# Patient Record
Sex: Female | Born: 1950
Health system: Southern US, Community
[De-identification: ages and names within clinical notes are randomized; demographics above are authoritative.]

## PROBLEM LIST (undated history)

## (undated) DIAGNOSIS — E079 Disorder of thyroid, unspecified: Secondary | ICD-10-CM

## (undated) DIAGNOSIS — G20 Parkinson's disease: Secondary | ICD-10-CM

## (undated) DIAGNOSIS — I4891 Unspecified atrial fibrillation: Secondary | ICD-10-CM

## (undated) DIAGNOSIS — G20A1 Parkinson's disease without dyskinesia, without mention of fluctuations: Secondary | ICD-10-CM

## (undated) DIAGNOSIS — F419 Anxiety disorder, unspecified: Secondary | ICD-10-CM

## (undated) DIAGNOSIS — I10 Essential (primary) hypertension: Secondary | ICD-10-CM

## (undated) HISTORY — PX: CHOLECYSTECTOMY: SHX55

## (undated) HISTORY — PX: KNEE SURGERY: SHX244

---

## 2006-07-17 ENCOUNTER — Ambulatory Visit: Payer: Self-pay

## 2007-11-17 ENCOUNTER — Ambulatory Visit: Payer: Self-pay | Admitting: Specialist

## 2007-11-17 ENCOUNTER — Other Ambulatory Visit: Payer: Self-pay

## 2007-12-19 ENCOUNTER — Inpatient Hospital Stay: Payer: Self-pay | Admitting: Specialist

## 2007-12-19 ENCOUNTER — Other Ambulatory Visit: Payer: Self-pay

## 2009-11-24 ENCOUNTER — Ambulatory Visit: Payer: Self-pay | Admitting: Internal Medicine

## 2009-12-09 ENCOUNTER — Ambulatory Visit: Payer: Self-pay | Admitting: Internal Medicine

## 2011-02-13 ENCOUNTER — Ambulatory Visit: Payer: Self-pay | Admitting: Internal Medicine

## 2012-02-14 ENCOUNTER — Ambulatory Visit: Payer: Self-pay | Admitting: Internal Medicine

## 2012-08-28 DIAGNOSIS — G20 Parkinson's disease: Secondary | ICD-10-CM | POA: Diagnosis not present

## 2013-03-12 DIAGNOSIS — R52 Pain, unspecified: Secondary | ICD-10-CM | POA: Diagnosis not present

## 2013-03-12 DIAGNOSIS — G20 Parkinson's disease: Secondary | ICD-10-CM | POA: Diagnosis not present

## 2013-08-26 DIAGNOSIS — G20 Parkinson's disease: Secondary | ICD-10-CM | POA: Diagnosis not present

## 2013-08-31 DIAGNOSIS — E038 Other specified hypothyroidism: Secondary | ICD-10-CM | POA: Diagnosis not present

## 2013-08-31 DIAGNOSIS — I10 Essential (primary) hypertension: Secondary | ICD-10-CM | POA: Diagnosis not present

## 2013-08-31 DIAGNOSIS — G219 Secondary parkinsonism, unspecified: Secondary | ICD-10-CM | POA: Diagnosis not present

## 2013-09-21 ENCOUNTER — Ambulatory Visit: Payer: Self-pay | Admitting: Internal Medicine

## 2013-09-21 DIAGNOSIS — Z1231 Encounter for screening mammogram for malignant neoplasm of breast: Secondary | ICD-10-CM | POA: Diagnosis not present

## 2013-09-21 DIAGNOSIS — R928 Other abnormal and inconclusive findings on diagnostic imaging of breast: Secondary | ICD-10-CM | POA: Diagnosis not present

## 2013-09-25 ENCOUNTER — Ambulatory Visit: Payer: Self-pay | Admitting: Internal Medicine

## 2013-09-25 DIAGNOSIS — N63 Unspecified lump in unspecified breast: Secondary | ICD-10-CM | POA: Diagnosis not present

## 2013-09-25 DIAGNOSIS — R922 Inconclusive mammogram: Secondary | ICD-10-CM | POA: Diagnosis not present

## 2013-12-01 DIAGNOSIS — G219 Secondary parkinsonism, unspecified: Secondary | ICD-10-CM | POA: Diagnosis not present

## 2013-12-01 DIAGNOSIS — I10 Essential (primary) hypertension: Secondary | ICD-10-CM | POA: Diagnosis not present

## 2013-12-01 DIAGNOSIS — D649 Anemia, unspecified: Secondary | ICD-10-CM | POA: Diagnosis not present

## 2013-12-01 DIAGNOSIS — E038 Other specified hypothyroidism: Secondary | ICD-10-CM | POA: Diagnosis not present

## 2013-12-01 DIAGNOSIS — E785 Hyperlipidemia, unspecified: Secondary | ICD-10-CM | POA: Diagnosis not present

## 2013-12-15 DIAGNOSIS — E038 Other specified hypothyroidism: Secondary | ICD-10-CM | POA: Diagnosis not present

## 2013-12-15 DIAGNOSIS — I10 Essential (primary) hypertension: Secondary | ICD-10-CM | POA: Diagnosis not present

## 2013-12-15 DIAGNOSIS — G219 Secondary parkinsonism, unspecified: Secondary | ICD-10-CM | POA: Diagnosis not present

## 2014-03-02 DIAGNOSIS — G20 Parkinson's disease: Secondary | ICD-10-CM | POA: Diagnosis not present

## 2014-03-17 DIAGNOSIS — E038 Other specified hypothyroidism: Secondary | ICD-10-CM | POA: Diagnosis not present

## 2014-03-17 DIAGNOSIS — G219 Secondary parkinsonism, unspecified: Secondary | ICD-10-CM | POA: Diagnosis not present

## 2014-03-29 ENCOUNTER — Ambulatory Visit: Payer: Self-pay | Admitting: Internal Medicine

## 2014-03-29 DIAGNOSIS — N63 Unspecified lump in breast: Secondary | ICD-10-CM | POA: Diagnosis not present

## 2014-08-25 DIAGNOSIS — G20 Parkinson's disease: Secondary | ICD-10-CM | POA: Diagnosis not present

## 2015-01-03 ENCOUNTER — Encounter: Payer: Self-pay | Admitting: *Deleted

## 2015-01-03 ENCOUNTER — Emergency Department
Admission: EM | Admit: 2015-01-03 | Discharge: 2015-01-03 | Disposition: A | Discharge status: Home / Self Care | Payer: Medicare Other | Attending: Emergency Medicine | Admitting: Emergency Medicine

## 2015-01-03 ENCOUNTER — Emergency Department: Payer: Medicare Other

## 2015-01-03 DIAGNOSIS — Y9289 Other specified places as the place of occurrence of the external cause: Secondary | ICD-10-CM | POA: Diagnosis not present

## 2015-01-03 DIAGNOSIS — Y998 Other external cause status: Secondary | ICD-10-CM | POA: Insufficient documentation

## 2015-01-03 DIAGNOSIS — W010XXA Fall on same level from slipping, tripping and stumbling without subsequent striking against object, initial encounter: Secondary | ICD-10-CM | POA: Diagnosis not present

## 2015-01-03 DIAGNOSIS — I10 Essential (primary) hypertension: Secondary | ICD-10-CM | POA: Insufficient documentation

## 2015-01-03 DIAGNOSIS — M7989 Other specified soft tissue disorders: Secondary | ICD-10-CM | POA: Diagnosis not present

## 2015-01-03 DIAGNOSIS — S52591A Other fractures of lower end of right radius, initial encounter for closed fracture: Secondary | ICD-10-CM | POA: Insufficient documentation

## 2015-01-03 DIAGNOSIS — S52501A Unspecified fracture of the lower end of right radius, initial encounter for closed fracture: Secondary | ICD-10-CM | POA: Diagnosis not present

## 2015-01-03 DIAGNOSIS — M25531 Pain in right wrist: Secondary | ICD-10-CM | POA: Diagnosis not present

## 2015-01-03 DIAGNOSIS — Y9389 Activity, other specified: Secondary | ICD-10-CM | POA: Diagnosis not present

## 2015-01-03 DIAGNOSIS — S6991XA Unspecified injury of right wrist, hand and finger(s), initial encounter: Secondary | ICD-10-CM | POA: Diagnosis not present

## 2015-01-03 HISTORY — DX: Disorder of thyroid, unspecified: E07.9

## 2015-01-03 HISTORY — DX: Parkinson's disease without dyskinesia, without mention of fluctuations: G20.A1

## 2015-01-03 HISTORY — DX: Parkinson's disease: G20

## 2015-01-03 HISTORY — DX: Essential (primary) hypertension: I10

## 2015-01-03 MED ORDER — TRAMADOL HCL 50 MG PO TABS: 50.0000 mg | ORAL_TABLET | Freq: Four times a day (QID) | ORAL | Status: DC | PRN

## 2015-01-03 NOTE — ED Provider Notes (Signed)
[facility] Emergency Department Provider Note  ____________________________________________  Time seen: Approximately 10:13 PM  I have reviewed the triage vital signs and the nursing notes.   HISTORY  Chief Complaint Wrist Injury   HPI Jasmine Mcdonald is a 64 y.o. female who presents to the emergency department for evaluation of right wrist pain. She states that she tripped over something and fell backward and tried to catch herself by putting her right hand behind her. Pain intensifies with movement of the right wrist.   Past Medical History  Diagnosis Date  . Parkinson's disease   . Thyroid disease   . Hypertension     There are no active problems to display for this patient.   Past Surgical History  Procedure Laterality Date  . Cholecystectomy    . Cesarean section    . Knee surgery      Current Outpatient Rx  Name  Route  Sig  Dispense  Refill  . traMADol (ULTRAM) 50 MG tablet   Oral   Take 1 tablet (50 mg total) by mouth every 6 (six) hours as needed.   9 tablet   0     Allergies Review of patient's allergies indicates no known allergies.  No family history on file.  Social History History  Substance Use Topics  . Smoking status: Never Smoker   . Smokeless tobacco: Not on file  . Alcohol Use: No    Review of Systems Constitutional: No recent illness. Eyes: No visual changes. ENT: No sore throat. Cardiovascular: Denies chest pain or palpitations. Respiratory: Denies shortness of breath. Gastrointestinal: No abdominal pain.  Genitourinary: Negative for dysuria. Musculoskeletal: Pain in right wrist. Skin: Negative for rash. Neurological: Negative for headaches, focal weakness or numbness. 10-point ROS otherwise negative.  ____________________________________________   PHYSICAL EXAM:  VITAL SIGNS: ED Triage Vitals  Enc Vitals Group     BP 01/03/15 2059 233/110 mmHg     Pulse Rate 01/03/15 2059 85     Resp  01/03/15 2059 16     Temp 01/03/15 2059 98.6 F (37 C)     Temp Source 01/03/15 2059 Oral     SpO2 01/03/15 2059 100 %     Weight 01/03/15 2059 178 lb (80.74 kg)     Height 01/03/15 2059 5\' 6"  (1.676 m)     Head Cir --      Peak Flow --      Pain Score 01/03/15 2100 5     Pain Loc --      Pain Edu? --      Excl. in GC? --     Constitutional: Alert and oriented. Well appearing and in no acute distress. Eyes: Conjunctivae are normal. EOMI. Head: Atraumatic. Nose: No congestion/rhinnorhea. Neck: No stridor.  Respiratory: Normal respiratory effort.   Musculoskeletal: Obvious deformity noted to the radial aspect of the right wrist. Radial pulse 2+. Capillary refill within normal limits. Neurologic:  Normal speech and language. No gross focal neurologic deficits are appreciated. Speech is normal. No gait instability. Skin:  Skin is warm, dry and intact. Atraumatic. Psychiatric: Mood and affect are normal. Speech and behavior are normal.  ____________________________________________   LABS (all labs ordered are listed, but only abnormal results are displayed)  Labs Reviewed - No data to display ____________________________________________  RADIOLOGY  Subtle nondisplaced fracture of the distal radial metaphysis. ____________________________________________   PROCEDURES  Procedure(s) performed: SPLINT APPLICATION Date/Time: 11:55 PM Authorized by: Kem Boroughs Consent: Verbal consent obtained. Risks and benefits:  risks, benefits and alternatives were discussed Consent given by: patient Splint applied by: ER tech Location details: Right wrist and forearm  Splint type: Sugar tong  Supplies used: Ortho-Glass and Ace  Post-procedure: The splinted body part was neurovascularly unchanged following the procedure. Patient tolerance: Patient tolerated the procedure well with no immediate complications.      ____________________________________________   INITIAL IMPRESSION  / ASSESSMENT AND PLAN / ED COURSE  Pertinent labs & imaging results that were available during my care of the patient were reviewed by me and considered in my medical decision making (see chart for details).  Patient was advised to call Dr. Katrinka Blazing who is her orthopedist tomorrow to schedule a follow-up appointment. She was advised to return to the emergency department for any symptoms that change or worsen if she is unable schedule an appointment. ____________________________________________   FINAL CLINICAL IMPRESSION(S) / ED DIAGNOSES  Final diagnoses:  Distal radius fracture, right, closed, initial encounter      Chinita Pester, FNP 01/03/15 2355  Governor Rooks, MD 01/06/15 [phone]

## 2015-01-03 NOTE — ED Notes (Signed)
Pt states that she tripped and fell on something today, c/o pain and swelling in the right wrist.

## 2015-01-04 DIAGNOSIS — S52514A Nondisplaced fracture of right radial styloid process, initial encounter for closed fracture: Secondary | ICD-10-CM | POA: Diagnosis not present

## 2015-01-24 DIAGNOSIS — S52514D Nondisplaced fracture of right radial styloid process, subsequent encounter for closed fracture with routine healing: Secondary | ICD-10-CM | POA: Diagnosis not present

## 2015-02-08 DIAGNOSIS — S52531D Colles' fracture of right radius, subsequent encounter for closed fracture with routine healing: Secondary | ICD-10-CM | POA: Diagnosis not present

## 2015-02-08 DIAGNOSIS — S52514D Nondisplaced fracture of right radial styloid process, subsequent encounter for closed fracture with routine healing: Secondary | ICD-10-CM | POA: Diagnosis not present

## 2015-03-02 DIAGNOSIS — G20 Parkinson's disease: Secondary | ICD-10-CM | POA: Diagnosis not present

## 2015-03-08 DIAGNOSIS — S52531D Colles' fracture of right radius, subsequent encounter for closed fracture with routine healing: Secondary | ICD-10-CM | POA: Diagnosis not present

## 2015-03-14 ENCOUNTER — Ambulatory Visit: Payer: Medicare Other | Attending: Orthopedic Surgery | Admitting: Occupational Therapy

## 2015-03-14 DIAGNOSIS — M79631 Pain in right forearm: Secondary | ICD-10-CM | POA: Diagnosis not present

## 2015-03-14 DIAGNOSIS — M25631 Stiffness of right wrist, not elsewhere classified: Secondary | ICD-10-CM | POA: Insufficient documentation

## 2015-03-14 DIAGNOSIS — M6281 Muscle weakness (generalized): Secondary | ICD-10-CM | POA: Diagnosis not present

## 2015-03-14 NOTE — Therapy (Signed)
[facility] [facility] MEDICINE [address], [place], [address] Phone: [phone]   Fax:  [phone]  Occupational Therapy Treatment  Patient Details  Name: Jasmine Mcdonald MRN: [id number] Date of Birth: 06/02/1951 Referring Provider:  Juanell Fairly, MD  Encounter Date: 03/14/2015      OT End of Session - 03/14/15 1129    Visit Number 1   Number of Visits 8   Date for OT Re-Evaluation 04/11/15   OT Start Time 1002   OT Stop Time 1100   OT Time Calculation (min) 58 min   Activity Tolerance Patient tolerated treatment well   Behavior During Therapy [facility] for tasks assessed/performed      Past Medical History  Diagnosis Date  . Parkinson's disease   . Thyroid disease   . Hypertension     Past Surgical History  Procedure Laterality Date  . Cholecystectomy    . Cesarean section    . Knee surgery      There were no vitals filed for this visit.  Visit Diagnosis:  Pain of right forearm - Plan: Ot plan of care cert/re-cert  Stiffness of right wrist joint - Plan: Ot plan of care cert/re-cert  Muscle weakness - Plan: Ot plan of care cert/re-cert      Subjective Assessment - 03/14/15 1031    Subjective  Fell while helping the grand kids pick up toys    Patient Stated Goals I want to use it like before and get the pain I sometims feel on side better    Currently in Pain? No/denies            [facility] OT Assessment - 03/14/15 0001    Assessment   Diagnosis R distal radius fracture   Onset Date 01/03/15   Assessment Pt fell and was seen in ER - seen Dr Martha Clan next day and casted - then in brace - pt report she took off splint over the weekend  the whole day but pain  on ulnar side this date - pt refer to OT/hand therapy 10/4   Home  Environment   Lives With Family   Prior Function   Level of Independence Independent   Leisure Pt is retired Runner, broadcasting/film/video , likes to [employer] , house hold activities, some gardening,   drive still - pt report having hard time twisting, carrying , pulling or twisting wrist - increase pain - open bottles, writing , curling hair, squeeze soap , cook , cutting,    AROM   Right Forearm Pronation 85 Degrees   Right Forearm Supination 72 Degrees   Left Forearm Pronation 90 Degrees   Left Forearm Supination 90 Degrees   Right Wrist Extension 50 Degrees   Right Wrist Flexion 53 Degrees   Right Wrist Radial Deviation 20 Degrees   Right Wrist Ulnar Deviation 22 Degrees   Left Wrist Extension 70 Degrees   Left Wrist Flexion 84 Degrees   Left Wrist Radial Deviation 25 Degrees   Left Wrist Ulnar Deviation 30 Degrees   Strength   Right Hand Grip (lbs) 15   Right Hand Lateral Pinch 9 lbs   Right Hand 3 Point Pinch 6 lbs   Left Hand Grip (lbs) 58   Left Hand Lateral Pinch 12 lbs   Left Hand 3 Point Pinch 18 lbs                  OT Treatments/Exercises (OP) - 03/14/15 0001    RUE Fluidotherapy  Number Minutes Fluidotherapy 12 Minutes   RUE Fluidotherapy Location Hand;Wrist   Comments increaes ROM and decrease pain prior to review of HEP    Splinting   Splinting Fitted with neoprene wrist spoint to use 2hrs off and on - had decrease pain with use of splint with pronation                 OT Education - 2015/03/29 1129    Education Details HEP   Person(s) Educated Patient   Methods Explanation;Demonstration;Tactile cues;Verbal cues;Handout   Comprehension Verbal cues required;Returned demonstration;Verbalized understanding          OT Short Term Goals - 2015-03-29 1133    OT SHORT TERM GOAL #1   Title Pain on PRWHE improve by at least 15 points    Baseline PRWHE for pain 30/50 at eval    Time 3   Period Weeks   Status New   OT SHORT TERM GOAL #2   Title Wrist AROM for flexion/extention/sup /pro improve by at least 10 degrees to turn doorknob , push door open and use more in playing  with grandkids   Baseline Wrist flexion 53, extention 50, sup 72 ,  pro 85   Time 4   Period Weeks   Status New           OT Long Term Goals - Mar 29, 2015 1135    OT LONG TERM GOAL #1   Title Grip strength improve by at least 15 lbs to use hand in cooking, cutting and carry 5 lbs with more ease    Baseline grip 15 R, L 58lbs    Time 5   Period Weeks   Status New   OT LONG TERM GOAL #2   Title Function on PRWHE improve with at least 10 points    Baseline function score at eal 23.5/50   Time 4   Period Weeks   Status New   OT LONG TERM GOAL #3   Title 3 point grip improve with 3-4 lbs to close open packages    Baseline 3 point R 6, L 18lbs    Time 4   Period Weeks   Status New               Plan - 03/29/2015 1130    Clinical Impression Statement Pt present 8-9 wks post R distal radius fracture - pt was in cast  and then brace - pt took brace off over the weekend and some increaes pain with use on ulnar side of wrist - 9/10 at the worse - pt is R hand dominant  - pt show decrease ROm at wrist in all planes , decrease grip and prehension strength - pt  limited in functional use of R hand in ADL's and IADL's  - pt can benefit from OT services    Pt will benefit from skilled therapeutic intervention in order to improve on the following deficits (Retired) Decreased strength;Decreased knowledge of precautions;Decreased range of motion;Impaired UE functional use;Increased edema;Impaired flexibility;Pain   Rehab Potential Good   Clinical Impairments Affecting Rehab Potential Parkinsons   OT Frequency 2x / week   OT Duration 4 weeks   OT Treatment/Interventions Self-care/ADL training;Fluidtherapy;Therapeutic exercise;Passive range of motion;Manual Therapy;Splinting;Patient/family education   Plan Assess ROM, pain and functional use    OT Home Exercise Plan see pt instruction   Consulted and Agree with Plan of Care Patient          G-Codes - 29-Mar-2015 1139  Functional Assessment Tool Used PRWHE, ROM and grip strength , clinicial judgement     Functional Limitation Self care   Self Care Current Status (Z6109) At least 40 percent but less than 60 percent impaired, limited or restricted   Self Care Goal Status (U0454) At least 1 percent but less than 20 percent impaired, limited or restricted      Problem List There are no active problems to display for this patient.   Oletta Cohn OTR/L,CLT 03/14/2015, 11:42 AM  [place] [facility] PHYSICAL AND SPORTS MEDICINE [address], [place], [address] Phone: [phone]   Fax:  [phone]

## 2015-03-14 NOTE — Patient Instructions (Signed)
Heat PROM for wrist extention/flexion/RD UD Followed by AROM  In above as well as Sup/pro

## 2015-03-17 ENCOUNTER — Ambulatory Visit: Payer: Medicare Other | Admitting: Occupational Therapy

## 2015-03-17 DIAGNOSIS — M79631 Pain in right forearm: Secondary | ICD-10-CM

## 2015-03-17 DIAGNOSIS — M6281 Muscle weakness (generalized): Secondary | ICD-10-CM | POA: Diagnosis not present

## 2015-03-17 DIAGNOSIS — M25631 Stiffness of right wrist, not elsewhere classified: Secondary | ICD-10-CM

## 2015-03-17 NOTE — Patient Instructions (Signed)
Add table slides for wrist extention  Add PROM for sup/pro end range prior to AROM   Add teal putty for grip, lat and 3 point grip - as well as pulling with all digits and pinching alternate digits  10 reps 2 x day

## 2015-03-17 NOTE — Therapy (Signed)
[facility] [facility]ICINE [address] [place], [place], [address] Phone: [phone]   Fax:  [phone]  Occupational Therapy Treatment  Patient Details  Name: Jasmine Mcdonald MRN: [id number] Date of Birth: [birth year] Referring Provider:  Juanell FairlyKrasinski, Kevin, MD  Encounter Date: 03/17/2015      OT End of Session - 03/17/15 1019    Visit Number 2   Number of Visits 8   Date for OT Re-Evaluation 04/11/15   OT Start Time 0930   OT Stop Time 1012   OT Time Calculation (min) 42 min   Activity Tolerance Patient tolerated treatment well   Behavior [facility] tasks assessed/performed      Past Medical History  Diagnosis Date  . Parkinson's disease   . Thyroid disease   . Hypertension     Past Surgical History  Procedure Laterality Date  . Cholecystectomy    . Cesarean section    . Knee surgery      There were no vitals filed for this visit.  Visit Diagnosis:  Pain of right forearm  Stiffness of right wrist joint  Muscle weakness      Subjective Assessment - 03/17/15 0940    Subjective  I could tell difference this morning - pain is better and I was able to open my medicine bottle    Patient Stated Goals I want to use it like before and get the pain I sometims feel on side better    Currently in Pain? No/denies            [facility] Assessment - 03/17/15 0001    AROM   Right Wrist Extension 63 Degrees   Right Wrist Flexion 63 Degrees                  OT Treatments/Exercises (OP) - 03/17/15 0001    Wrist Exercises   Other wrist exercises PROM of wrist flexion /extention; supination /pronation end range - followed by AROM pain less than 2/10   Other wrist exercises 16oz hammer for UD and RD - needed min A - was doing it horizontal plain; Table slides for wrist extention 15 reps    Hand Exercises   Other Hand Exercises Teal putty for grip, lat grip, 3 point , alternate digits , as well  as pulling with all digits 10 reps each  - add to HEP   RUE Fluidotherapy   Number Minutes Fluidotherapy 12 Minutes   RUE Fluidotherapy Location Hand;Wrist   Comments decrease pain and increaese ROM                 OT Education - 03/17/15 1019    Education provided Yes   Education Details HEP   Person(s) Educated Patient   Methods Explanation;Demonstration;Tactile cues;Verbal cues;Handout   Comprehension Returned demonstration;Verbalized understanding;Verbal cues required          OT Short Term Goals - 03/14/15 1133    OT SHORT TERM GOAL #1   Title Pain on PRWHE improve by at least 15 points    Baseline PRWHE for pain 30/50 at eval    Time 3   Period Weeks   Status New   OT SHORT TERM GOAL #2   Title Wrist AROM for flexion/extention/sup /pro improve by at least 10 degrees to turn doorknob , push door open and use more in playing  with grandkids   Baseline Wrist flexion 53, extention 50, sup 72 , pro 85  Time 4   Period Weeks   Status New           OT Long Term Goals - 03/14/15 1135    OT LONG TERM GOAL #1   Title Grip strength improve by at least 15 lbs to use hand in cooking, cutting and carry 5 lbs with more ease    Baseline grip 15 R, L 58lbs    Time 5   Period Weeks   Status New   OT LONG TERM GOAL #2   Title Function on PRWHE improve with at least 10 points    Baseline function score at eal 23.5/50   Time 4   Period Weeks   Status New   OT LONG TERM GOAL #3   Title 3 point grip improve with 3-4 lbs to close open packages    Baseline 3 point R 6, L 18lbs    Time 4   Period Weeks   Status New               Plan - 03/17/15 1020    Clinical Impression Statement Pt report decreaes pain and able to open her medicine yesterday - wearing soft splint 2 hrs on and off- can switch now to 3 off and 1 on - PROM add for supination because pain better - but keep pain under 2/10 - showed increase flexion and extention since then - cont to increaes ROM  and strength in R dominant hand    Pt will benefit from skilled therapeutic intervention in order to improve on the following deficits (Retired) Decreased strength;Decreased knowledge of precautions;Decreased range of motion;Impaired UE functional use;Increased edema;Impaired flexibility;Pain   Rehab Potential Good   Clinical Impairments Affecting Rehab Potential Parkinsons   OT Frequency 2x / week   OT Duration 4 weeks   OT Treatment/Interventions Self-care/ADL training;Fluidtherapy;Therapeutic exercise;Passive range of motion;Manual Therapy;Splinting;Patient/family education   Plan HEP upgrade as needed , strengthening more    OT Home Exercise Plan see pt instruction   Consulted and Agree with Plan of Care Patient        Problem List There are no active problems to display for this patient.   Oletta Cohn OTR/L,CLT 03/17/2015, 10:23 AM  [place] [facility] PHYSICAL AND SPORTS MEDICINE [address], [place], [address] Phone: [phone]   Fax:  [phone]

## 2015-03-21 ENCOUNTER — Ambulatory Visit: Payer: Medicare Other | Admitting: Occupational Therapy

## 2015-03-22 ENCOUNTER — Ambulatory Visit: Payer: Medicare Other | Admitting: Occupational Therapy

## 2015-03-23 ENCOUNTER — Ambulatory Visit: Payer: Medicare Other | Admitting: Occupational Therapy

## 2015-03-23 DIAGNOSIS — M79631 Pain in right forearm: Secondary | ICD-10-CM | POA: Diagnosis not present

## 2015-03-23 DIAGNOSIS — M6281 Muscle weakness (generalized): Secondary | ICD-10-CM

## 2015-03-23 DIAGNOSIS — M25631 Stiffness of right wrist, not elsewhere classified: Secondary | ICD-10-CM

## 2015-03-23 NOTE — Patient Instructions (Signed)
Upgrade putty to green - and add twisting BW and FW   And hold off on UD - do only RD PROM  Add 1 lbs for wrist extention, flexion  UD and RD 1 lbs in standing - 2 x UD and 1 x RD -  SUp/pro 1 lbs   10 reps   2 x day

## 2015-03-23 NOTE — Therapy (Signed)
[place] [facility]IC[facility] [address] [place], [place], [address] Phone: [phone]   Fax:  [phone]  Occupational Therapy Treatment  Patient Details  Name: Jasmine Mcdonald MRN: [id number] Date of Birth: 09-08-1950 No Data Recorded  Encounter Date: 03/23/2015      OT End of Session - 03/23/15 1004    Visit Number 3   Number of Visits 8   Date for OT Re-Evaluation 04/11/15   OT Start Time 0912   OT Stop Time 1000   OT Time Calculation (min) 48 min   Activity Tolerance Patient tolerated treatment well   Behavior During Therapy [facility] for tasks assessed/performed      Past Medical History  Diagnosis Date  . Parkinson's disease   . Thyroid disease   . Hypertension     Past Surgical History  Procedure Laterality Date  . Cholecystectomy    . Cesarean section    . Knee surgery      There were no vitals filed for this visit.  Visit Diagnosis:  Pain of right forearm  Stiffness of right wrist joint  Muscle weakness      Subjective Assessment - 03/23/15 0911    Subjective  Using it more,not wearing brace - and did not had pain    Patient Stated Goals I want to use it like before and get the pain I sometims feel on side better    Currently in Pain? Yes   Pain Score 2    Pain Location Wrist   Pain Orientation Right   Pain Descriptors / Indicators Aching            [facility] OT Assessment - 03/23/15 0001    AROM   Right Forearm Pronation 90 Degrees   Right Forearm Supination 75 Degrees   Left Forearm Pronation 90 Degrees   Left Forearm Supination 90 Degrees   Right Wrist Extension 63 Degrees   Right Wrist Flexion 63 Degrees   Right Wrist Radial Deviation 23 Degrees   Right Wrist Ulnar Deviation 25 Degrees   Left Wrist Extension 70 Degrees   Left Wrist Flexion 85 Degrees   Left Wrist Radial Deviation 25 Degrees   Left Wrist Ulnar Deviation 30 Degrees   Strength   Right Hand Grip (lbs) 22   Right Hand  Lateral Pinch 11 lbs   Right Hand 3 Point Pinch 8 lbs   Left Hand Grip (lbs) 58   Left Hand Lateral Pinch 2 lbs   Left Hand 3 Point Pinch 18 lbs                  OT Treatments/Exercises (OP) - 03/23/15 0001    Wrist Exercises   Other wrist exercises PROM of sup/ wrist extention and flexion - followed by 1 lbs weight for wrist ext/flexiion, RD and UD in standing , sup/pro - attempted hammer  but increase pain for sup - and  compensate with flexion instead of UD - UD to be done 2 reps and 2 RD -    Other wrist exercises pt need 75% v/c and min tc to keep 3rd MC in line with mid forearm - wants to RD duringf wrist exercises    Hand Exercises   Other Hand Exercises Green putty fro gripping, lat grip , 3 point grip , pulling and twisting with all digits  - 10 to 13 reps    RUE Fluidotherapy   Number Minutes Fluidotherapy 12 Minutes   RUE Fluidotherapy  Location Hand;Wrist   Comments at [facility] to increase ROM                  OT Education - 03/23/15 1003    Education provided Yes   Education Details HEP   Person(s) Educated Patient   Methods Explanation;Demonstration;Tactile cues;Verbal cues;Handout   Comprehension Verbal cues required;Returned demonstration;Verbalized understanding          OT Short Term Goals - 03/23/15 1007    OT SHORT TERM GOAL #1   Title Pain on PRWHE improve by at least 15 points    Baseline PRWHE for pain 30/50 at eval    Time 3   Period Weeks   Status On-going   OT SHORT TERM GOAL #2   Title Wrist AROM for flexion/extention/sup /pro improve by at least 10 degrees to turn doorknob , push door open and use more in playing  with grandkids   Baseline Wrist flexion 53, extention 50, sup 72 , pro 85   Time 4   Period Weeks   Status On-going           OT Long Term Goals - 03/23/15 1007    OT LONG TERM GOAL #1   Title Grip strength improve by at least 15 lbs to use hand in cooking, cutting and carry 5 lbs with more ease    Baseline grip  15 R, L 58lbs    Time 5   Period Weeks   Status On-going   OT LONG TERM GOAL #2   Title Function on PRWHE improve with at least 10 points    Baseline function score at eal 23.5/50   Time 4   Period Weeks   Status On-going               Plan - 03/23/15 1005    Clinical Impression Statement Pt cont to show increase ROM at wrist and strength - see flowsheet - pt  still have pain in sup when added weight - could not tolerate more than 1 lbs - add to HEP strength trng with 1 lbs in all planes but need to focus on 3rd MC inline with mid forearm -  hand wants to RD during wrist exercies and gripping    Pt will benefit from skilled therapeutic intervention in order to improve on the following deficits (Retired) Decreased strength;Decreased knowledge of precautions;Decreased range of motion;Impaired UE functional use;Increased edema;Impaired flexibility;Pain   Rehab Potential Good   Clinical Impairments Affecting Rehab Potential Parkinsons   OT Frequency 2x / week   OT Duration 2 weeks   OT Treatment/Interventions Self-care/ADL training;Fluidtherapy;Therapeutic exercise;Passive range of motion;Manual Therapy;Splinting;Patient/family education   Plan HEP upgrade , strengthening to tolerance    OT Home Exercise Plan see pt instruction   Consulted and Agree with Plan of Care Patient        Problem List There are no active problems to display for this patient.   Oletta Cohn OTR/L,CLT 03/23/2015, 10:11 AM  [place] [facility] PHYSICAL AND SPORTS MEDICINE [address] [place], [address] Phone: [phone]   Fax:  [phone]  Name: Jasmine Mcdonald MRN: [id number] Date of Birth: Jan 03, 1951

## 2015-03-28 ENCOUNTER — Ambulatory Visit: Payer: Medicare Other | Admitting: Occupational Therapy

## 2015-03-28 DIAGNOSIS — M25631 Stiffness of right wrist, not elsewhere classified: Secondary | ICD-10-CM

## 2015-03-28 DIAGNOSIS — M79631 Pain in right forearm: Secondary | ICD-10-CM

## 2015-03-28 DIAGNOSIS — M6281 Muscle weakness (generalized): Secondary | ICD-10-CM | POA: Diagnosis not present

## 2015-03-28 NOTE — Therapy (Signed)
[facility] PHYSICAL AND SPORTS MEDICINE [address] [place], [place], [address] Phone: [phone]   Fax:  [phone]  Occupational Therapy Treatment  Patient Details  Name:  Jasmine Mcdonald MRN: [id number] Date of Birth: 1950/07/18 No Data Recorded  Encounter Date: 03/28/2015      OT End of Session - 03/28/15 1453    Visit Number 4   Number of Visits 8   Date for OT Re-Evaluation 04/11/15   OT Start Time 0928   OT Stop Time 1008   OT Time Calculation (min) 40 min   Activity Tolerance Patient tolerated treatment well   Behavior During Therapy [facility] for tasks assessed/performed      Past Medical History  Diagnosis Date  . Parkinson's disease   . Thyroid disease   . Hypertension     Past Surgical History  Procedure Laterality Date  . Cholecystectomy    . Cesarean section    . Knee surgery      There were no vitals filed for this visit.  Visit Diagnosis:  Pain of right forearm  Stiffness of right wrist joint  Muscle weakness      Subjective Assessment - 03/28/15 0929    Subjective  My grandson grabbed my hand and it hurts since then - that was last night - I am just tired this am - before that exercises was okay    Patient Stated Goals I want to use it like before and get the pain I sometims feel on side better    Currently in Pain? Yes   Pain Score 3    Pain Location Wrist   Pain Orientation Right   Pain Descriptors / Indicators Aching            [facility] OT Assessment - 03/28/15 0001    AROM   Right Wrist Extension --   Right Wrist Flexion --   Right Wrist Radial Deviation --   Right Wrist Ulnar Deviation --                  OT Treatments/Exercises (OP) - 03/28/15 0001    Wrist Exercises   Other wrist exercises PROM for wrist flexion/extention/ 10 reps each/; 1 lbs weigth for ext/flexion 10 reps; 1 lbs for UD and RD 10 reps standing ;   Other wrist exercises Wrist extention 1 kg ball on the wall-  push and hold for 1 min ;  SUp/pro PROM and place and hold - less pain - 1 lbs sup/rpo 10 lbs   Hand Exercises   Other Hand Exercises Green putty for griping, pulling , twisting, 3 point and lat grip    Other Hand Exercises ROlling putty add and pinching alternate digits - need Min A with grip, twisting and key grip    RUE Fluidotherapy   Number Minutes Fluidotherapy 12 Minutes   RUE Fluidotherapy Location Hand;Wrist   Comments At soc to decrease pain and increase ROM    Manual Therapy   Manual therapy comments Soft tissue mobs to distal wrist , gentle traction prior to PROM sup , place and hold -pt report decrease pain afterwards                 OT Education - 03/28/15 1453    Education provided Yes   Education Details HEP   Person(s) Educated Patient   Methods Explanation;Demonstration;Tactile cues;Verbal cues;Handout   Comprehension Verbal cues required;Returned demonstration;Verbalized understanding          OT  Short Term Goals - 03/23/15 1007    OT SHORT TERM GOAL #1   Title Pain on PRWHE improve by at least 15 points    Baseline PRWHE for pain 30/50 at eval    Time 3   Period Weeks   Status On-going   OT SHORT TERM GOAL #2   Title Wrist AROM for flexion/extention/sup /pro improve by at least 10 degrees to turn doorknob , push door open and use more in playing  with grandkids   Baseline Wrist flexion 53, extention 50, sup 72 , pro 85   Time 4   Period Weeks   Status On-going           OT Long Term Goals - 03/23/15 1007    OT LONG TERM GOAL #1   Title Grip strength improve by at least 15 lbs to use hand in cooking, cutting and carry 5 lbs with more ease    Baseline grip 15 R, L 58lbs    Time 5   Period Weeks   Status On-going   OT LONG TERM GOAL #2   Title Function on PRWHE improve with at least 10 points    Baseline function score at eal 23.5/50   Time 4   Period Weeks   Status On-going               Plan - 03/28/15 1453    Clinical  Impression Statement Pt showing progress - had initially increase pain with sup this date - but to end of session decrease - could not upgrade HEP because of that - but will reassess grip and ROM next session    Pt will benefit from skilled therapeutic intervention in order to improve on the following deficits (Retired) Decreased strength;Decreased knowledge of precautions;Decreased range of motion;Impaired UE functional use;Increased edema;Impaired flexibility;Pain   Rehab Potential Good   Clinical Impairments Affecting Rehab Potential Parkinsons   OT Frequency 2x / week   OT Treatment/Interventions Self-care/ADL training;Fluidtherapy;Therapeutic exercise;Passive range of motion;Manual Therapy;Splinting;Patient/family education   Plan Assess ROM and grip   OT Home Exercise Plan see pt instruction   Consulted and Agree with Plan of Care Patient        Problem List There are no active problems to display for this patient.   Oletta Cohn OTR/L,CLT 03/28/2015, 2:56 PM  [place] [facility] PHYSICAL AND SPORTS MEDICINE [address], [place], [address] Phone: [phone]   Fax:  [phone]  Name: Jasmine Mcdonald MRN: [id number] Date of Birth: July 24, 1950

## 2015-03-28 NOTE — Patient Instructions (Addendum)
Add to putty rolling with pinching alternate digits -  Same for others but can do place and hold wrist extention with 1 lbs if finding she is not getting up high enough Watch still out for alignment

## 2015-03-30 ENCOUNTER — Ambulatory Visit: Payer: Medicare Other | Admitting: Occupational Therapy

## 2015-03-30 DIAGNOSIS — M79631 Pain in right forearm: Secondary | ICD-10-CM | POA: Diagnosis not present

## 2015-03-30 DIAGNOSIS — M6281 Muscle weakness (generalized): Secondary | ICD-10-CM

## 2015-03-30 DIAGNOSIS — M25631 Stiffness of right wrist, not elsewhere classified: Secondary | ICD-10-CM | POA: Diagnosis not present

## 2015-03-30 NOTE — Patient Instructions (Signed)
Same HEP - but increase to 2 lbs for all  12 reps and in week can do 2 sets   Add in week dark blue firm putty to green to increase resistance  Can cont with HEP until follow up with MD the 16th Nov

## 2015-03-30 NOTE — Therapy (Signed)
[place] PHYSICAL AND SPORTS MEDICINE [address], [place], [address] Phone: [phone]   Fax:  [phone]  Occupational Therapy Treatment  Patient Details  Name: PROMYSE ARDITO MRN: [id number] Date of Birth: 27-May-1951 No Data Recorded  Encounter Date: 03/30/2015      OT End of Session - 03/30/15 1137    Visit Number 5   Number of Visits 5   Date for OT Re-Evaluation 03/30/15   OT Start Time 1040   OT Stop Time 1133   OT Time Calculation (min) 53 min   Activity Tolerance Patient tolerated treatment well   Behavior During Therapy [facility] for tasks assessed/performed      Past Medical History  Diagnosis Date  . Parkinson's disease   . Thyroid disease   . Hypertension     Past Surgical History  Procedure Laterality Date  . Cholecystectomy    . Cesarean section    . Knee surgery      There were no vitals filed for this visit.  Visit Diagnosis:  Pain of right forearm  Stiffness of right wrist joint  Muscle weakness      Subjective Assessment - 03/30/15 1048    Subjective  My hand is so much better - I have my hand back - thank you so much             [facility] OT Assessment - 03/30/15 0001    AROM   Right Forearm Pronation 90 Degrees   Right Forearm Supination 90 Degrees   Right Wrist Extension 65 Degrees   Right Wrist Flexion 67 Degrees   Right Wrist Radial Deviation 25 Degrees   Right Wrist Ulnar Deviation 28 Degrees   Left Wrist Extension 70 Degrees   Left Wrist Flexion 85 Degrees   Left Wrist Radial Deviation 25 Degrees   Left Wrist Ulnar Deviation 30 Degrees   Strength   Right Hand Grip (lbs) 25   Right Hand Lateral Pinch 11 lbs   Right Hand 3 Point Pinch 11 lbs   Left Hand Grip (lbs) 58   Left Hand Lateral Pinch 12 lbs   Left Hand 3 Point Pinch 18 lbs                  OT Treatments/Exercises (OP) - 03/30/15 0001    ADLs   ADL Comments DId PRWHE for pain and function - see score  under goals    Wrist Exercises   Other wrist exercises 2 lbs for UD /RD, sup/pro and wrist extnetion and flexion - 12 reps - can increaes in week to 2sets    Other wrist exercises Wrist extnetion fatigue after 12 reps - measurements taken for grip and ROM at [facility] - wall pushups done 12 reps - no pain with any    Hand Exercises   Other Hand Exercises Green putty grip, pulling , twisting and 3 point - add in week dark blue putty to increase resistance    RUE Fluidotherapy   Number Minutes Fluidotherapy 12 Minutes   RUE Fluidotherapy Location Hand;Wrist   Comments prior to review of HEP                 OT Education - 03/30/15 1137    Education provided Yes   Education Details HEP    Person(s) Educated Patient   Methods Explanation;Demonstration;Verbal cues;Handout;Tactile cues   Comprehension Verbalized understanding;Returned demonstration;Verbal cues required          OT Short  Term Goals - 04/27/15 1057    OT SHORT TERM GOAL #1   Title Pain on PRWHE improve by at least 15 points    Baseline Pain on PRHWE no 2/50; at eval was 30/50   Status Achieved   OT SHORT TERM GOAL #2   Title Wrist AROM for flexion/extention/sup /pro improve by at least 10 degrees to turn doorknob , push door open and use more in playing  with grandkids   Baseline Wrist flexion and extention 65- 67 degrees ,  sup WNL    Status Achieved           OT Long Term Goals - 04-27-2015 1108    OT LONG TERM GOAL #1   Title Grip strength improve by at least 15 lbs to use hand in cooking, cutting and carry 5 lbs with more ease    OT LONG TERM GOAL #2   Title Function on PRWHE improve with at least 10 points    Baseline 06/05/48  for function ; at eval 23.5/50   Status Achieved   OT LONG TERM GOAL #3   Title 3 point grip improve with 3-4 lbs to close open packages    Baseline 3 point grip improved to 11lbs    Status Achieved               Plan - 04-27-15 1138    Clinical Impression Statement Pt  made great progress in 5 viists - pain and functional use - wrist ROM and grip/prehension improved greatly - pt met all goals and is discharge from OT services with HEP to cont with until her appt with MD    OT Treatment/Interventions Self-care/ADL training;Fluidtherapy;Therapeutic exercise;Passive range of motion;Manual Therapy;Splinting;Patient/family education   Plan discharge with HEP    OT Home Exercise Plan see pt instruction   Consulted and Agree with Plan of Care Patient          G-Codes - 04/27/15 1139    Functional Assessment Tool Used PRWHE, ROM and grip strength , clinicial judgement    Functional Limitation Self care   Self Care Current Status (O9735) At least 1 percent but less than 20 percent impaired, limited or restricted   Self Care Goal Status (H2992) At least 1 percent but less than 20 percent impaired, limited or restricted   Self Care Discharge Status [phone]) At least 1 percent but less than 20 percent impaired, limited or restricted      Problem List There are no active problems to display for this patient.   Rosalyn Gess OTR/L,CLT 2015-04-27, 11:41 AM  [place] PHYSICAL AND SPORTS MEDICINE [address] [place], [address] Phone: [phone]   Fax:  [phone]  Name: SAMIE BARCLIFT MRN: [id number] Date of Birth: 1950/11/17

## 2015-09-02 DIAGNOSIS — I10 Essential (primary) hypertension: Secondary | ICD-10-CM | POA: Diagnosis not present

## 2015-09-02 DIAGNOSIS — G219 Secondary parkinsonism, unspecified: Secondary | ICD-10-CM | POA: Diagnosis not present

## 2015-09-02 DIAGNOSIS — E038 Other specified hypothyroidism: Secondary | ICD-10-CM | POA: Diagnosis not present

## 2015-11-05 DIAGNOSIS — Z79899 Other long term (current) drug therapy: Secondary | ICD-10-CM | POA: Diagnosis not present

## 2015-11-05 DIAGNOSIS — M25462 Effusion, left knee: Secondary | ICD-10-CM | POA: Diagnosis not present

## 2015-11-05 DIAGNOSIS — F419 Anxiety disorder, unspecified: Secondary | ICD-10-CM | POA: Diagnosis not present

## 2015-11-05 DIAGNOSIS — Z6829 Body mass index (BMI) 29.0-29.9, adult: Secondary | ICD-10-CM | POA: Diagnosis not present

## 2015-11-05 DIAGNOSIS — Z791 Long term (current) use of non-steroidal anti-inflammatories (NSAID): Secondary | ICD-10-CM | POA: Diagnosis not present

## 2015-11-05 DIAGNOSIS — G20 Parkinson's disease: Secondary | ICD-10-CM | POA: Diagnosis not present

## 2015-11-05 DIAGNOSIS — R52 Pain, unspecified: Secondary | ICD-10-CM | POA: Diagnosis not present

## 2015-11-05 DIAGNOSIS — M25562 Pain in left knee: Secondary | ICD-10-CM | POA: Diagnosis not present

## 2015-11-05 DIAGNOSIS — M1712 Unilateral primary osteoarthritis, left knee: Secondary | ICD-10-CM | POA: Diagnosis not present

## 2015-11-05 DIAGNOSIS — I10 Essential (primary) hypertension: Secondary | ICD-10-CM | POA: Diagnosis not present

## 2015-12-30 DIAGNOSIS — M2242 Chondromalacia patellae, left knee: Secondary | ICD-10-CM | POA: Diagnosis not present

## 2016-01-02 DIAGNOSIS — G219 Secondary parkinsonism, unspecified: Secondary | ICD-10-CM | POA: Diagnosis not present

## 2016-01-02 DIAGNOSIS — I10 Essential (primary) hypertension: Secondary | ICD-10-CM | POA: Diagnosis not present

## 2016-01-02 DIAGNOSIS — E038 Other specified hypothyroidism: Secondary | ICD-10-CM | POA: Diagnosis not present

## 2016-03-29 DIAGNOSIS — G20 Parkinson's disease: Secondary | ICD-10-CM | POA: Diagnosis not present

## 2016-04-03 DIAGNOSIS — I10 Essential (primary) hypertension: Secondary | ICD-10-CM | POA: Diagnosis not present

## 2016-04-03 DIAGNOSIS — G219 Secondary parkinsonism, unspecified: Secondary | ICD-10-CM | POA: Diagnosis not present

## 2016-04-03 DIAGNOSIS — E038 Other specified hypothyroidism: Secondary | ICD-10-CM | POA: Diagnosis not present

## 2016-04-03 DIAGNOSIS — Z23 Encounter for immunization: Secondary | ICD-10-CM | POA: Diagnosis not present

## 2016-04-03 DIAGNOSIS — E669 Obesity, unspecified: Secondary | ICD-10-CM | POA: Diagnosis not present

## 2016-07-03 DIAGNOSIS — E038 Other specified hypothyroidism: Secondary | ICD-10-CM | POA: Diagnosis not present

## 2016-07-03 DIAGNOSIS — G219 Secondary parkinsonism, unspecified: Secondary | ICD-10-CM | POA: Diagnosis not present

## 2016-07-03 DIAGNOSIS — I10 Essential (primary) hypertension: Secondary | ICD-10-CM | POA: Diagnosis not present

## 2016-07-03 DIAGNOSIS — E669 Obesity, unspecified: Secondary | ICD-10-CM | POA: Diagnosis not present

## 2017-02-10 ENCOUNTER — Emergency Department
Admission: EM | Admit: 2017-02-10 | Discharge: 2017-02-10 | Disposition: A | Discharge status: Home / Self Care | Payer: Medicare Other | Attending: Emergency Medicine | Admitting: Emergency Medicine

## 2017-02-10 ENCOUNTER — Encounter: Payer: Self-pay | Admitting: Emergency Medicine

## 2017-02-10 DIAGNOSIS — F419 Anxiety disorder, unspecified: Secondary | ICD-10-CM | POA: Diagnosis not present

## 2017-02-10 DIAGNOSIS — I10 Essential (primary) hypertension: Secondary | ICD-10-CM | POA: Diagnosis not present

## 2017-02-10 DIAGNOSIS — Z79899 Other long term (current) drug therapy: Secondary | ICD-10-CM | POA: Insufficient documentation

## 2017-02-10 DIAGNOSIS — G20 Parkinson's disease: Secondary | ICD-10-CM | POA: Insufficient documentation

## 2017-02-10 LAB — COMPREHENSIVE METABOLIC PANEL
ALK PHOS: 103 U/L (ref 38–126)
ALT: <5 U/L — ABNORMAL LOW (ref 14–54)
AST: 15 U/L (ref 15–41)
Albumin: 4.2 g/dL (ref 3.5–5.0)
Anion gap: 7 (ref 5–15)
BILIRUBIN TOTAL: 0.5 mg/dL (ref 0.3–1.2)
BUN: 17 mg/dL (ref 6–20)
CALCIUM: 9.6 mg/dL (ref 8.9–10.3)
CO2: 27 mmol/L (ref 22–32)
CREATININE: 0.79 mg/dL (ref 0.44–1.00)
Chloride: 108 mmol/L (ref 101–111)
GFR calc Af Amer: >60 mL/min (ref >60)
Glucose, Bld: 124 mg/dL — ABNORMAL HIGH (ref 65–99)
Potassium: 4.5 mmol/L (ref 3.5–5.1)
Sodium: 142 mmol/L (ref 135–145)
Total Protein: 7.4 g/dL (ref 6.5–8.1)

## 2017-02-10 LAB — URINALYSIS, COMPLETE (UACMP) WITH MICROSCOPIC
Bilirubin Urine: NEGATIVE
GLUCOSE, UA: NEGATIVE mg/dL
HGB URINE DIPSTICK: NEGATIVE
Ketones, ur: NEGATIVE mg/dL
Leukocytes, UA: NEGATIVE
Nitrite: NEGATIVE
PH: 7 (ref 5.0–8.0)
Protein, ur: NEGATIVE mg/dL
RBC / HPF: NONE SEEN RBC/hpf (ref 0–5)
SPECIFIC GRAVITY, URINE: 1.006 (ref 1.005–1.030)

## 2017-02-10 LAB — CBC WITH DIFFERENTIAL/PLATELET
Basophils Absolute: 0 10*3/uL (ref 0–0.1)
Basophils Relative: 1 %
Eosinophils Absolute: 0.1 10*3/uL (ref 0–0.7)
Eosinophils Relative: 2 %
HCT: 41.2 % (ref 35.0–47.0)
Hemoglobin: 14.2 g/dL (ref 12.0–16.0)
LYMPHS PCT: 11 %
Lymphs Abs: 0.9 10*3/uL — ABNORMAL LOW (ref 1.0–3.6)
MCH: 33.4 pg (ref 26.0–34.0)
MCHC: 34.6 g/dL (ref 32.0–36.0)
MCV: 96.7 fL (ref 80.0–100.0)
Monocytes Absolute: 0.4 10*3/uL (ref 0.2–0.9)
Monocytes Relative: 5 %
Neutro Abs: 6.5 10*3/uL (ref 1.4–6.5)
Neutrophils Relative %: 81 %
Platelets: 241 10*3/uL (ref 150–440)
RBC: 4.26 MIL/uL (ref 3.80–5.20)
RDW: 13.7 % (ref 11.5–14.5)
WBC: 7.9 10*3/uL (ref 3.6–11.0)

## 2017-02-10 MED ORDER — ALPRAZOLAM 0.5 MG PO TABS
1.0000 mg | ORAL_TABLET | Freq: Once | ORAL | Status: AC
  Administered 2017-02-10: 1 mg via ORAL
  Filled 2017-02-10: qty 2

## 2017-02-10 NOTE — ED Provider Notes (Addendum)
[facility] Emergency Department Provider Note  ____________________________________________   I have reviewed the triage vital signs and the nursing notes.   HISTORY  Chief Complaint Anxiety and Hypertension    HPI Jasmine Mcdonald is a 66 y.o. female presents today complaining of feeling anxious. She does Parkinson's disease. She states when she takes her Parkinson's medications, and does not work right away she gets very anxious.she states that this is the medication starts working she gets less anxious. She has not taken it yet this evening. She does have a history of hypertension. She denies chest pain or shortness of breath or focal numbness or weakness. She is followed very closely by neurology. Family are concerned about her level of anxiety about her Parkinson's. They would like me to give her something for her nerves while she is here.       Past Medical History:  Diagnosis Date  . Hypertension   . Parkinson's disease (HCC)   . Thyroid disease     There are no active problems to display for this patient.   Past Surgical History:  Procedure Laterality Date  . CESAREAN SECTION    . CHOLECYSTECTOMY    . KNEE SURGERY      Prior to Admission medications   Medication Sig Start Date End Date Taking? Authorizing Provider  traMADol (ULTRAM) 50 MG tablet Take 1 tablet (50 mg total) by mouth every 6 (six) hours as needed. 01/03/15   Chinita Pester, FNP    Allergies Patient has no known allergies.  History reviewed. No pertinent family history.  Social History Social History  Substance Use Topics  . Smoking status: Never Smoker  . Smokeless tobacco: Never Used  . Alcohol use No    Review of Systems Constitutional: No fever/chills Eyes: No visual changes. ENT: No sore throat. No stiff neck no neck pain Cardiovascular: Denies chest pain. Respiratory: Denies shortness of breath. Gastrointestinal:   no vomiting.  No diarrhea.  No  constipation. Genitourinary: Negative for dysuria. Musculoskeletal: Negative lower extremity swelling Skin: Negative for rash. Neurological: Negative for severe headaches, focal weakness or numbness.   ____________________________________________   PHYSICAL EXAM:  VITAL SIGNS: ED Triage Vitals  Enc Vitals Group     BP 02/10/17 1918 (!) 198/86     Pulse Rate 02/10/17 1918 76     Resp 02/10/17 1918 16     Temp 02/10/17 1918 98.6 F (37 C)     Temp Source 02/10/17 1918 Oral     SpO2 02/10/17 1918 98 %     Weight 02/10/17 1919 154 lb (69.9 kg)     Height 02/10/17 1919  (1.702 m)     Head Circumference --      Peak Flow --      Pain Score --      Pain Loc --      Pain Edu? --      Excl. in GC? --     Constitutional: Alert and oriented. Well appearing and in no acute distress. Eyes: Conjunctivae are normal Head: Atraumatic HEENT: No congestion/rhinnorhea. Mucous membranes are moist.  Oropharynx non-erythematous Neck:   Nontender with no meningismus, no masses, no stridor Cardiovascular: Normal rate, regular rhythm. Grossly normal heart sounds.  Good peripheral circulation. Respiratory: Normal respiratory effort.  No retractions. Lungs CTAB. Abdominal: Soft and nontender. No distention. No guarding no rebound Back:  There is no focal tenderness or step off.  there is no midline tenderness there are no lesions  noted. there is no CVA tenderness Musculoskeletal: No lower extremity tenderness, no upper extremity tenderness. No joint effusions, no DVT signs strong distal pulses no edema Neurologic:  Normal speech and language. No gross focal neurologic deficits are appreciated.  Skin:  Skin is warm, dry and intact. No rash noted. Psychiatric: Mood and affect are quite anxious. Speech and behavior are normal.  ____________________________________________   LABS (all labs ordered are listed, but only abnormal results are displayed)  Labs Reviewed  CBC WITH  DIFFERENTIAL/PLATELET  COMPREHENSIVE METABOLIC PANEL  URINALYSIS, COMPLETE (UACMP) WITH MICROSCOPIC   ____________________________________________  EKG  I personally interpreted any EKGs ordered by me or triage sinus rhythm, rate 65 bpm no acute ST elevation or depression nonspecificST changes ____________________________________________  RADIOLOGY  I reviewed any imaging ordered by me or triage that were performed during my shift and, if possible, patient and/or family made aware of any abnormal findings. ____________________________________________   PROCEDURES  Procedure(s) performed: None  Procedures  Critical Care performed: None  ____________________________________________   INITIAL IMPRESSION / ASSESSMENT AND PLAN / ED COURSE  Pertinent labs & imaging results that were available during my care of the patient were reviewed by me and considered in my medical decision making (see chart for details).  patient here feeling anxious about her Parkinson's disease but with no evidence of focal numbness or weakness and no evidence of inability to ambulate and apparently at her neurologic baseline. No evidence of ACS PE or dissection. The family do basic blood work to ensure that there is no other occult pathology lurking underneath the surface that could be causing her to feel worse and we will give her something for her nerves here. The husband takes Xanax and she feels that that would help her and I will certainly give her a dose here although I will not send her home with Xanax, I will defer that to primary care.  ----------------------------------------- 11:12 PM on 02/10/2017 -----------------------------------------  patient in no acute distress feels "great" after the Xanax,would like to go home. Return precautions and follow-up given and understood.I have expanded to her that we don't usually prescribe that medication of the emergency room. Patient states her based  problem is few that her medications are not went to work. I have encouraged her to seek help with her primary care doctor, and possibly with counseling. There does not appear to be any acute organic pathology present patient and family are very interested in discharge at this time. We will give him instructions and they will come back if they feel worse    ____________________________________________   FINAL CLINICAL IMPRESSION(S) / ED DIAGNOSES  Final diagnoses:  None      This chart was dictated using voice recognition software.  Despite best efforts to proofread,  errors can occur which can change meaning.      Jeanmarie PlantMcShane, Larene Ascencio A, MD 02/10/17 2122    Jeanmarie PlantMcShane, Brenya Taulbee A, MD 02/10/17 2313    Jeanmarie PlantMcShane, Tawonna Esquer A, MD 02/10/17 [phone]

## 2017-02-10 NOTE — ED Triage Notes (Signed)
Patient presents to Emergency Department via [facility] with complaints of anxiety. Pt reports history of thyroid, Parkinson's and HTN.  Pt reports compliance with but between pt's anxiety, new meds that haven't started yet and pt's family anxiety pt felt she needed to come to ED for additional help.  Pt calm and cooperative, denies SI/HI/hx of behavioral problems.    One family member will be allowed back per this RN judgement and location of medical pt in quad.

## 2017-02-12 DIAGNOSIS — G219 Secondary parkinsonism, unspecified: Secondary | ICD-10-CM | POA: Diagnosis not present

## 2017-02-12 DIAGNOSIS — I10 Essential (primary) hypertension: Secondary | ICD-10-CM | POA: Diagnosis not present

## 2017-02-12 DIAGNOSIS — E038 Other specified hypothyroidism: Secondary | ICD-10-CM | POA: Diagnosis not present

## 2017-02-12 DIAGNOSIS — E669 Obesity, unspecified: Secondary | ICD-10-CM | POA: Diagnosis not present

## 2017-03-26 DIAGNOSIS — Z23 Encounter for immunization: Secondary | ICD-10-CM | POA: Diagnosis not present

## 2017-03-26 DIAGNOSIS — Z79810 Long term (current) use of selective estrogen receptor modulators (SERMs): Secondary | ICD-10-CM | POA: Diagnosis not present

## 2017-03-26 DIAGNOSIS — I10 Essential (primary) hypertension: Secondary | ICD-10-CM | POA: Diagnosis not present

## 2017-03-26 DIAGNOSIS — G219 Secondary parkinsonism, unspecified: Secondary | ICD-10-CM | POA: Diagnosis not present

## 2017-03-26 DIAGNOSIS — E669 Obesity, unspecified: Secondary | ICD-10-CM | POA: Diagnosis not present

## 2017-06-25 DIAGNOSIS — I10 Essential (primary) hypertension: Secondary | ICD-10-CM | POA: Diagnosis not present

## 2017-06-25 DIAGNOSIS — G219 Secondary parkinsonism, unspecified: Secondary | ICD-10-CM | POA: Diagnosis not present

## 2017-06-25 DIAGNOSIS — E038 Other specified hypothyroidism: Secondary | ICD-10-CM | POA: Diagnosis not present

## 2017-06-25 DIAGNOSIS — E669 Obesity, unspecified: Secondary | ICD-10-CM | POA: Diagnosis not present

## 2017-09-13 DIAGNOSIS — E669 Obesity, unspecified: Secondary | ICD-10-CM | POA: Diagnosis not present

## 2017-09-13 DIAGNOSIS — Z79810 Long term (current) use of selective estrogen receptor modulators (SERMs): Secondary | ICD-10-CM | POA: Diagnosis not present

## 2017-09-13 DIAGNOSIS — G219 Secondary parkinsonism, unspecified: Secondary | ICD-10-CM | POA: Diagnosis not present

## 2017-09-13 DIAGNOSIS — I10 Essential (primary) hypertension: Secondary | ICD-10-CM | POA: Diagnosis not present

## 2017-09-16 DIAGNOSIS — E034 Atrophy of thyroid (acquired): Secondary | ICD-10-CM | POA: Diagnosis not present

## 2017-09-16 DIAGNOSIS — I10 Essential (primary) hypertension: Secondary | ICD-10-CM | POA: Diagnosis not present

## 2017-09-16 DIAGNOSIS — E7849 Other hyperlipidemia: Secondary | ICD-10-CM | POA: Diagnosis not present

## 2017-09-16 DIAGNOSIS — R5381 Other malaise: Secondary | ICD-10-CM | POA: Diagnosis not present

## 2017-10-02 ENCOUNTER — Ambulatory Visit: Payer: Medicare Other | Attending: Nurse Practitioner | Admitting: Physical Therapy

## 2017-10-07 ENCOUNTER — Ambulatory Visit: Payer: Medicare Other | Admitting: Physical Therapy

## 2017-10-08 ENCOUNTER — Ambulatory Visit: Payer: Medicare Other | Admitting: Physical Therapy

## 2017-10-09 ENCOUNTER — Ambulatory Visit: Payer: Medicare Other | Admitting: Physical Therapy

## 2017-10-10 ENCOUNTER — Ambulatory Visit: Payer: Medicare Other | Admitting: Physical Therapy

## 2017-10-14 ENCOUNTER — Ambulatory Visit: Payer: Medicare Other | Admitting: Physical Therapy

## 2017-10-15 ENCOUNTER — Ambulatory Visit: Payer: Medicare Other | Admitting: Physical Therapy

## 2017-10-16 ENCOUNTER — Ambulatory Visit: Payer: Medicare Other | Admitting: Physical Therapy

## 2017-10-17 ENCOUNTER — Ambulatory Visit: Payer: Medicare Other | Admitting: Physical Therapy

## 2017-10-21 ENCOUNTER — Ambulatory Visit: Payer: Medicare Other | Admitting: Physical Therapy

## 2017-10-22 ENCOUNTER — Ambulatory Visit: Payer: Medicare Other | Admitting: Physical Therapy

## 2017-10-23 ENCOUNTER — Ambulatory Visit: Payer: Medicare Other | Admitting: Physical Therapy

## 2017-10-24 ENCOUNTER — Ambulatory Visit: Payer: Medicare Other | Admitting: Physical Therapy

## 2017-10-29 ENCOUNTER — Ambulatory Visit: Payer: Medicare Other | Admitting: Physical Therapy

## 2017-10-30 ENCOUNTER — Ambulatory Visit: Payer: Medicare Other | Admitting: Physical Therapy

## 2017-10-31 ENCOUNTER — Ambulatory Visit: Payer: Medicare Other | Admitting: Physical Therapy

## 2017-11-04 ENCOUNTER — Ambulatory Visit: Payer: Medicare Other | Admitting: Physical Therapy

## 2018-07-28 ENCOUNTER — Encounter: Payer: Self-pay | Admitting: Emergency Medicine

## 2018-07-28 ENCOUNTER — Other Ambulatory Visit: Payer: Self-pay

## 2018-07-28 ENCOUNTER — Emergency Department
Admission: EM | Admit: 2018-07-28 | Discharge: 2018-07-29 | Disposition: A | Discharge status: Home / Self Care | Payer: Medicare Other | Attending: Emergency Medicine | Admitting: Emergency Medicine

## 2018-07-28 DIAGNOSIS — G20 Parkinson's disease: Secondary | ICD-10-CM | POA: Diagnosis not present

## 2018-07-28 DIAGNOSIS — R112 Nausea with vomiting, unspecified: Secondary | ICD-10-CM | POA: Diagnosis not present

## 2018-07-28 DIAGNOSIS — I10 Essential (primary) hypertension: Secondary | ICD-10-CM | POA: Diagnosis not present

## 2018-07-28 HISTORY — DX: Anxiety disorder, unspecified: F41.9

## 2018-07-28 LAB — COMPREHENSIVE METABOLIC PANEL
ALBUMIN: 3.8 g/dL (ref 3.5–5.0)
ALT: 5 U/L (ref 0–44)
ANION GAP: 8 (ref 5–15)
AST: 22 U/L (ref 15–41)
Alkaline Phosphatase: 103 U/L (ref 38–126)
BILIRUBIN TOTAL: 0.8 mg/dL (ref 0.3–1.2)
BUN: 26 mg/dL — ABNORMAL HIGH (ref 8–23)
CHLORIDE: 104 mmol/L (ref 98–111)
CO2: 24 mmol/L (ref 22–32)
Calcium: 8.9 mg/dL (ref 8.9–10.3)
Creatinine, Ser: 0.77 mg/dL (ref 0.44–1.00)
GFR calc Af Amer: >60 mL/min (ref >60)
GFR calc non Af Amer: >60 mL/min (ref >60)
GLUCOSE: 118 mg/dL — AB (ref 70–99)
POTASSIUM: 3.5 mmol/L (ref 3.5–5.1)
Sodium: 136 mmol/L (ref 135–145)
Total Protein: 6.7 g/dL (ref 6.5–8.1)

## 2018-07-28 LAB — CBC
HEMATOCRIT: 39.4 % (ref 36.0–46.0)
HEMOGLOBIN: 12.5 g/dL (ref 12.0–15.0)
MCH: 32.5 pg (ref 26.0–34.0)
MCHC: 31.7 g/dL (ref 30.0–36.0)
MCV: 102.3 fL — ABNORMAL HIGH (ref 80.0–100.0)
Platelets: 267 10*3/uL (ref 150–400)
RBC: 3.85 MIL/uL — AB (ref 3.87–5.11)
RDW: 14.6 % (ref 11.5–15.5)
WBC: 10.7 10*3/uL — AB (ref 4.0–10.5)
nRBC: 0 % (ref 0.0–0.2)

## 2018-07-28 LAB — LIPASE, BLOOD: LIPASE: 21 U/L (ref 11–51)

## 2018-07-28 MED ORDER — ONDANSETRON HCL 4 MG PO TABS: 4.0000 mg | ORAL_TABLET | Freq: Three times a day (TID) | ORAL | 0 refills | Status: AC | PRN

## 2018-07-28 MED ORDER — SODIUM CHLORIDE 0.9% FLUSH: 3.0000 mL | Freq: Once | INTRAVENOUS | Status: DC

## 2018-07-28 MED ORDER — SODIUM CHLORIDE 0.9 % IV BOLUS: 1000.0000 mL | Freq: Once | INTRAVENOUS | Status: DC

## 2018-07-28 MED ORDER — ONDANSETRON HCL 4 MG/2ML IJ SOLN
4.0000 mg | Freq: Once | INTRAMUSCULAR | Status: AC
  Administered 2018-07-28: 4 mg via INTRAVENOUS
  Filled 2018-07-28: qty 2

## 2018-07-28 NOTE — ED Triage Notes (Signed)
Pt presents to ED via [facility] with c/o N/V that started yesterday. Pt states her and her daughter both have symptoms at this time. Per EMS pt also has +2 Pedal Edema at this time. Pt is afebrile at this time, hx of HTN.

## 2018-07-28 NOTE — ED Provider Notes (Signed)
[facility] Emergency Department Provider Note  ____________________________________________   I have reviewed the triage vital signs and the nursing notes.   HISTORY  Chief Complaint Emesis and Nausea   History limited by: Not Limited   HPI Jasmine Mcdonald is a 68 y.o. female who presents to the emergency department today because of concern for nausea and vomiting. The patient states the symptoms started last night around midnight. Multiple episodes of vomiting. States that her daughter had the same symptoms. Daughter also had diarrhea. The patient denies any fevers. States that she has not been able to keep down any of her medications. She had had increasing weakness today and difficulty ambulating.    Per medical record review patient has a history of HTN, cholecystectomy.   Past Medical History:  Diagnosis Date  . Anxiety   . Hypertension   . Parkinson's disease (HCC)   . Thyroid disease     There are no active problems to display for this patient.   Past Surgical History:  Procedure Laterality Date  . CESAREAN SECTION    . CHOLECYSTECTOMY    . KNEE SURGERY      Prior to Admission medications   Medication Sig Start Date End Date Taking? Authorizing Provider  traMADol (ULTRAM) 50 MG tablet Take 1 tablet (50 mg total) by mouth every 6 (six) hours as needed. 01/03/15   Chinita Pester, FNP    Allergies Patient has no known allergies.  No family history on file.  Social History Social History   Tobacco Use  . Smoking status: Never Smoker  . Smokeless tobacco: Never Used  Substance Use Topics  . Alcohol use: No  . Drug use: No    Review of Systems Constitutional: No fever/chills Eyes: No visual changes. ENT: No sore throat. Cardiovascular: Denies chest pain. Respiratory: Denies shortness of breath. Gastrointestinal: No abdominal pain.  No nausea, no vomiting.  No diarrhea.   Genitourinary: Negative for  dysuria. Musculoskeletal: Negative for back pain. Skin: Negative for rash. Neurological: Negative for headaches, focal weakness or numbness.  ____________________________________________   PHYSICAL EXAM:  VITAL SIGNS: ED Triage Vitals  Enc Vitals Group     BP 07/28/18 1650 (!) 174/91     Pulse Rate 07/28/18 1650 (!) 104     Resp 07/28/18 1650 20     Temp 07/28/18 1650 98.4 F (36.9 C)     Temp Source 07/28/18 1650 Oral     SpO2 07/28/18 1650 98 %     Weight 07/28/18 1651 177 lb (80.3 kg)     Height 07/28/18 1651 5\' 7"  (1.702 m)     Head Circumference --      Peak Flow --      Pain Score 07/28/18 1650 6   Constitutional: Alert and oriented.  Eyes: Conjunctivae are normal.  ENT      Head: Normocephalic and atraumatic.      Nose: No congestion/rhinnorhea.      Mouth/Throat: Mucous membranes are moist.      Neck: No stridor. Hematological/Lymphatic/Immunilogical: No cervical lymphadenopathy. Cardiovascular: Normal rate, regular rhythm.  No murmurs, rubs, or gallops.  Respiratory: Normal respiratory effort without tachypnea nor retractions. Breath sounds are clear and equal bilaterally. No wheezes/rales/rhonchi. Gastrointestinal: Soft and non tender. No rebound. No guarding.  Genitourinary: Deferred Musculoskeletal: Normal range of motion in all extremities. Positive for lower extremity edema.  Neurologic:  Normal speech and language.  Skin:  Skin is warm, dry and intact. No rash noted. Psychiatric:  Mood and affect are normal. Speech and behavior are normal. Patient exhibits appropriate insight and judgment.  ____________________________________________    LABS (pertinent positives/negatives)  Lipase 21 CBC wbc 10.7, hgb 12.5, plt 267 CMP wnl except glu 118, BUN 26  ____________________________________________   EKG  None  ____________________________________________     RADIOLOGY  None  ____________________________________________   PROCEDURES  Procedures  ____________________________________________   INITIAL IMPRESSION / ASSESSMENT AND PLAN / ED COURSE  Pertinent labs & imaging results that were available during my care of the patient were reviewed by me and considered in my medical decision making (see chart for details).   Patient presents because of concern for nausea vomiting. Other family member with similar symptoms. At this time think likely gastroenteritis vs food poisoning. The patient was given iv fluids and nausea medication and did feel improvement. In terms of slightly swelling of the lower legs she did state she sat in a chair for most of the night and day. At this point think dependent edema. Do expect it to improve in a flat position. Discussed work up and plan with patient.   ____________________________________________   FINAL CLINICAL IMPRESSION(S) / ED DIAGNOSES  Final diagnoses:  Non-intractable vomiting with nausea, unspecified vomiting type     Note: This dictation was prepared with Dragon dictation. Any transcriptional errors that result from this process are unintentional     Phineas Semen, MD 07/29/18 0006

## 2018-07-28 NOTE — Discharge Instructions (Signed)
Please seek medical attention for any high fevers, chest pain, shortness of breath, change in behavior, persistent vomiting, bloody stool or any other new or concerning symptoms.  

## 2019-12-11 ENCOUNTER — Other Ambulatory Visit: Payer: Self-pay | Admitting: *Deleted

## 2019-12-11 MED ORDER — CANDESARTAN CILEXETIL 16 MG PO TABS: 16.0000 mg | ORAL_TABLET | Freq: Every day | ORAL | 0 refills | Status: DC

## 2020-01-17 ENCOUNTER — Other Ambulatory Visit: Payer: Self-pay | Admitting: Internal Medicine

## 2020-02-04 ENCOUNTER — Other Ambulatory Visit: Payer: Self-pay | Admitting: Internal Medicine

## 2020-03-02 ENCOUNTER — Other Ambulatory Visit: Payer: Self-pay | Admitting: Internal Medicine

## 2020-03-29 ENCOUNTER — Other Ambulatory Visit: Payer: Self-pay | Admitting: Internal Medicine

## 2020-04-24 ENCOUNTER — Other Ambulatory Visit: Payer: Self-pay | Admitting: Internal Medicine

## 2020-05-26 ENCOUNTER — Other Ambulatory Visit: Payer: Self-pay | Admitting: Internal Medicine

## 2020-06-01 ENCOUNTER — Other Ambulatory Visit: Payer: Self-pay | Admitting: Internal Medicine

## 2020-06-27 ENCOUNTER — Other Ambulatory Visit: Payer: Self-pay | Admitting: Internal Medicine

## 2020-07-23 ENCOUNTER — Other Ambulatory Visit: Payer: Self-pay | Admitting: Internal Medicine

## 2020-08-18 ENCOUNTER — Other Ambulatory Visit: Payer: Self-pay | Admitting: Internal Medicine

## 2020-08-31 ENCOUNTER — Ambulatory Visit: Payer: Medicare Other | Admitting: Internal Medicine

## 2020-09-17 ENCOUNTER — Other Ambulatory Visit: Payer: Self-pay | Admitting: Internal Medicine

## 2020-09-22 ENCOUNTER — Emergency Department
Admission: EM | Admit: 2020-09-22 | Discharge: 2020-09-22 | Disposition: A | Discharge status: Home / Self Care | Payer: Medicare PPO | Attending: Emergency Medicine | Admitting: Emergency Medicine

## 2020-09-22 ENCOUNTER — Other Ambulatory Visit: Payer: Self-pay

## 2020-09-22 DIAGNOSIS — R197 Diarrhea, unspecified: Secondary | ICD-10-CM | POA: Diagnosis not present

## 2020-09-22 DIAGNOSIS — R350 Frequency of micturition: Secondary | ICD-10-CM | POA: Insufficient documentation

## 2020-09-22 DIAGNOSIS — I10 Essential (primary) hypertension: Secondary | ICD-10-CM | POA: Diagnosis not present

## 2020-09-22 DIAGNOSIS — E860 Dehydration: Secondary | ICD-10-CM | POA: Insufficient documentation

## 2020-09-22 DIAGNOSIS — Z79899 Other long term (current) drug therapy: Secondary | ICD-10-CM | POA: Insufficient documentation

## 2020-09-22 DIAGNOSIS — R1110 Vomiting, unspecified: Secondary | ICD-10-CM | POA: Diagnosis not present

## 2020-09-22 DIAGNOSIS — Z20822 Contact with and (suspected) exposure to covid-19: Secondary | ICD-10-CM | POA: Diagnosis not present

## 2020-09-22 DIAGNOSIS — R531 Weakness: Secondary | ICD-10-CM | POA: Diagnosis present

## 2020-09-22 DIAGNOSIS — G20 Parkinson's disease: Secondary | ICD-10-CM | POA: Diagnosis not present

## 2020-09-22 LAB — URINALYSIS, COMPLETE (UACMP) WITH MICROSCOPIC
Bacteria, UA: NONE SEEN
Bilirubin Urine: NEGATIVE
Glucose, UA: NEGATIVE mg/dL
Hgb urine dipstick: NEGATIVE
Ketones, ur: NEGATIVE mg/dL
Leukocytes,Ua: NEGATIVE
Nitrite: NEGATIVE
Protein, ur: NEGATIVE mg/dL
Specific Gravity, Urine: 1.002 — ABNORMAL LOW (ref 1.005–1.030)
pH: 8 (ref 5.0–8.0)

## 2020-09-22 LAB — CBC WITH DIFFERENTIAL/PLATELET
Abs Immature Granulocytes: 0.03 10*3/uL (ref 0.00–0.07)
Basophils Absolute: 0 10*3/uL (ref 0.0–0.1)
Basophils Relative: 0 %
Eosinophils Absolute: 0.2 10*3/uL (ref 0.0–0.5)
Eosinophils Relative: 3 %
HCT: 36.8 % (ref 36.0–46.0)
Hemoglobin: 12 g/dL (ref 12.0–15.0)
Immature Granulocytes: 0 %
Lymphocytes Relative: 16 %
Lymphs Abs: 1.3 10*3/uL (ref 0.7–4.0)
MCH: 32.3 pg (ref 26.0–34.0)
MCHC: 32.6 g/dL (ref 30.0–36.0)
MCV: 99.2 fL (ref 80.0–100.0)
Monocytes Absolute: 0.6 10*3/uL (ref 0.1–1.0)
Monocytes Relative: 7 %
Neutro Abs: 5.9 10*3/uL (ref 1.7–7.7)
Neutrophils Relative %: 74 %
Platelets: 250 10*3/uL (ref 150–400)
RBC: 3.71 MIL/uL — ABNORMAL LOW (ref 3.87–5.11)
RDW: 13.2 % (ref 11.5–15.5)
WBC: 8.1 10*3/uL (ref 4.0–10.5)
nRBC: 0 % (ref 0.0–0.2)

## 2020-09-22 LAB — COMPREHENSIVE METABOLIC PANEL
ALT: 7 U/L (ref 0–44)
AST: 19 U/L (ref 15–41)
Albumin: 4.1 g/dL (ref 3.5–5.0)
Alkaline Phosphatase: 76 U/L (ref 38–126)
Anion gap: 7 (ref 5–15)
BUN: 17 mg/dL (ref 8–23)
CO2: 27 mmol/L (ref 22–32)
Calcium: 9.4 mg/dL (ref 8.9–10.3)
Chloride: 99 mmol/L (ref 98–111)
Creatinine, Ser: 0.9 mg/dL (ref 0.44–1.00)
GFR, Estimated: >60 mL/min (ref >60)
Glucose, Bld: 110 mg/dL — ABNORMAL HIGH (ref 70–99)
Potassium: 4.2 mmol/L (ref 3.5–5.1)
Sodium: 133 mmol/L — ABNORMAL LOW (ref 135–145)
Total Bilirubin: 0.5 mg/dL (ref 0.3–1.2)
Total Protein: 6.8 g/dL (ref 6.5–8.1)

## 2020-09-22 LAB — MAGNESIUM: Magnesium: 2.1 mg/dL (ref 1.7–2.4)

## 2020-09-22 LAB — RESP PANEL BY RT-PCR (FLU A&B, COVID) ARPGX2
Influenza A by PCR: NEGATIVE
Influenza B by PCR: NEGATIVE
SARS Coronavirus 2 by RT PCR: NEGATIVE

## 2020-09-22 MED ORDER — LACTATED RINGERS IV BOLUS
1000.0000 mL | Freq: Once | INTRAVENOUS | Status: AC
  Administered 2020-09-22: 1000 mL via INTRAVENOUS

## 2020-09-22 NOTE — ED Notes (Signed)
Per Dr. Fuller Plan, husband is going to get her medications from home. Pt states that it started to work about 15-73mins after given. Will try and get pt up at this time.

## 2020-09-22 NOTE — ED Notes (Signed)
Pt husband returned back with medications. Pt given graham crackers, applesauce, and water to take with medications. Dr. Fuller Plan, MD made aware.

## 2020-09-22 NOTE — Discharge Instructions (Addendum)
Work-up was reassuring no signs of infection.  Sodium was slightly low.  Drink the Gatorade without sugar and follow-up with your primary care doctor for blood pressure recheck.  Return to the ER for any other concerns

## 2020-09-22 NOTE — ED Triage Notes (Addendum)
Pt is a 70 y/o w/ a hx of Parkinson's coming from home with a cc of generalized weakness and dehydration. Pt reports stomach bug approx 2 weeks ago and was having trouble keeping down fluids and medication. Pt reports that today she was unable to walk all day and that is usually a sign that she is dehydrated.

## 2020-09-22 NOTE — ED Provider Notes (Signed)
[facility] Emergency Department Provider Note  ____________________________________________   Event Date/Time   First MD Initiated Contact with Patient 09/22/20 [phone]     (approximate)  I have reviewed the triage vital signs and the nursing notes.   HISTORY  Chief Complaint Weakness    HPI Jasmine Mcdonald is a 70 y.o. female with Parkinson's who comes in with generalized weakness and dehydration.  Patient reported having a stomach bug last week.  Patient states that she had multiple episodes of diarrhea and vomiting which prevented her from keeping her medications down for her Parkinson's.  Patient is followed very closely by [facility] neurology.  The next appointment is not till next month.  She states that they have been helping her get her back on her medications and she still not her full dosage.  She states that they called her neurologist last night who told her to come into the ER to have her labs checked.  She states that she felt like she was dehydrated and that she was having increased difficulty moving and picking up both legs.  One side was not weaker than the other.  She states that her husband was having to help her ambulate and while ambulating she had a almost fall but the husband was able to catch.  She did not hit her head did not lose consciousness.  She suspects that her weakness is secondary to her medications not being in her system as much secondary to the GI illness.  According to husband she typically ambulates with a walker but sometimes with her Parkinson's she cannot ambulate at all.  She reports being COVID vaccinated and having trouble keeping fluids and medications down.  There was concern that she was unable to walk today and they were concerned that she was dehydrated.  She does report frequent urination but states that she has been drinking water.  She denies any chest pain, shortness of breath, cough, abdominal pain.  Denies any headaches.   Denies any weakness on one side of the body, slurred speech or other new neurodeficit.          Past Medical History:  Diagnosis Date  . Anxiety   . Hypertension   . Parkinson's disease (HCC)   . Thyroid disease     There are no problems to display for this patient.   Past Surgical History:  Procedure Laterality Date  . CESAREAN SECTION    . CHOLECYSTECTOMY    . KNEE SURGERY      Prior to Admission medications   Medication Sig Start Date End Date Taking? Authorizing Provider  candesartan (ATACAND) 16 MG tablet TAKE 1 TABLET BY MOUTH EVERY DAY 08/18/20   Corky Downs, MD  levothyroxine (SYNTHROID) 100 MCG tablet TAKE 1 TABLET BY MOUTH EVERY DAY 06/06/20   Corky Downs, MD  metoprolol tartrate (LOPRESSOR) 50 MG tablet TAKE 1 TABLET BY MOUTH TWICE A DAY 06/06/20   Corky Downs, MD  ondansetron (ZOFRAN) 4 MG tablet Take 1 tablet (4 mg total) by mouth every 8 (eight) hours as needed. 07/28/18   Phineas Semen, MD  traMADol (ULTRAM) 50 MG tablet Take 1 tablet (50 mg total) by mouth every 6 (six) hours as needed. 01/03/15   Chinita Pester, FNP    Allergies Patient has no known allergies.  History reviewed. No pertinent family history.  Social History Social History   Tobacco Use  . Smoking status: Never Smoker  . Smokeless tobacco: Never Used  Substance  Use Topics  . Alcohol use: No  . Drug use: No      Review of Systems Constitutional: No fever/chills, generalized weakness Eyes: No visual changes. ENT: No sore throat. Cardiovascular: Denies chest pain. Respiratory: Denies shortness of breath. Gastrointestinal: No abdominal pain.  No nausea, no vomiting.  No diarrhea.  No constipation. Genitourinary: Negative for dysuria.  Increased urination Musculoskeletal: Negative for back pain. Skin: Negative for rash. Neurological: Negative for headaches, focal weakness or numbness. All other ROS negative ____________________________________________   PHYSICAL  EXAM:  VITAL SIGNS: ED Triage Vitals  Enc Vitals Group     BP 09/22/20 0616 (!) 218/84     Pulse Rate 09/22/20 0616 62     Resp 09/22/20 0616 18     Temp 09/22/20 0616 98.4 F (36.9 C)     Temp Source 09/22/20 0616 Oral     SpO2 09/22/20 0616 99 %     Weight 09/22/20 0617 190 lb 0.6 oz (86.2 kg)     Height 09/22/20 0617 5\' 7"  (1.702 m)     Head Circumference --      Peak Flow --      Pain Score 09/22/20 0617 0     Pain Loc --      Pain Edu? --      Excl. in GC? --     Constitutional: Alert and oriented. Well appearing and in no acute distress. Eyes: Conjunctivae are normal. EOMI. Head: Atraumatic. Nose: No congestion/rhinnorhea. Mouth/Throat: Mucous membranes are moist.   Neck: No stridor. Trachea Midline. FROM Cardiovascular: Normal rate, regular rhythm. Grossly normal heart sounds.  Good peripheral circulation.  No chest wall tenderness Respiratory: Normal respiratory effort.  No retractions. Lungs CTAB. Gastrointestinal: Soft and nontender. No distention. No abdominal bruits.  Musculoskeletal: No lower extremity tenderness nor edema.  No joint effusions.  Able to lift both legs up off the bed. Neurologic:  Normal speech and language. No gross focal neurologic deficits are appreciated.  Cranial 2 through 12 are intact.  Equal strength in arms and legs.  Sensation intact throughout. Skin:  Skin is warm, dry and intact. No rash noted. Psychiatric: Mood and affect are normal. Speech and behavior are normal. GU: Deferred   ____________________________________________   LABS (all labs ordered are listed, but only abnormal results are displayed)  Labs Reviewed  CBC WITH DIFFERENTIAL/PLATELET - Abnormal; Notable for the following components:      Result Value   RBC 3.71 (*)    All other components within normal limits  COMPREHENSIVE METABOLIC PANEL - Abnormal; Notable for the following components:   Sodium 133 (*)    Glucose, Bld 110 (*)    All other components within  normal limits  URINALYSIS, COMPLETE (UACMP) WITH MICROSCOPIC - Abnormal; Notable for the following components:   Color, Urine COLORLESS (*)    APPearance CLEAR (*)    Specific Gravity, Urine 1.002 (*)    All other components within normal limits  RESP PANEL BY RT-PCR (FLU A&B, COVID) ARPGX2  MAGNESIUM   ____________________________________________   ED ECG REPORT I, 09/24/20, the attending physician, personally viewed and interpreted this ECG.  Sinus rate of 60, no ST elevation, no T wave inversions, right bundle branch block.  Last EKG was over 3 years ago at which time patient did have a incomplete right bundle branch block  ____________________________________________    PROCEDURES  Procedure(s) performed (including Critical Care):  .1-3 Lead EKG Interpretation Performed by: Concha Se, MD Authorized by:  Concha SeFunke, Tasmine Hipwell E, MD     Interpretation: normal     ECG rate:  60s    ECG rate assessment: normal     Rhythm: sinus rhythm     Ectopy: none     Conduction: normal       ____________________________________________   INITIAL IMPRESSION / ASSESSMENT AND PLAN / ED COURSE  Jasmine Mcdonald was evaluated in Emergency Department on 09/22/2020 for the symptoms described in the history of present illness. She was evaluated in the context of the global COVID-19 pandemic, which necessitated consideration that the patient might be at risk for infection with the SARS-CoV-2 virus that causes COVID-19. Institutional protocols and algorithms that pertain to the evaluation of patients at risk for COVID-19 are in a state of rapid change based on information released by regulatory bodies including the CDC and federal and state organizations. These policies and algorithms were followed during the patient's care in the ED.    Patient is a 70 year old with Parkinson's who comes in with generalized weakness and difficulties ambulating secondary to recent GI illness with inability to  keep down her Parkinson's medications.  According to husband this is happened before with prior GI illnesses.  At this time she no longer has nausea vomiting or diarrhea.  Her abdomen is soft and nontender I have low suspicion for acute abdominal process.  She denies any chest pain or shortness of breath to suggest pneumonia or ACS.  No evidence of myxedema coma on exam.  Labs ordered to evaluate for Electra abnormalities, AKI, UTI.  I did note that patient blood pressure was elevated.  Patient blood pressure typically runs in the 180s to 190s systolic.  I asked patient about it she stated that she just gets very anxious when coming to the emergency room.  She states that she did take her blood pressure medication this morning.  Patient did have a FALL today but denies any injuries from the fall and has no tenderness on her exam.  She did not hit her head and this was witnessed by her husband so we have elected to hold off on CT imaging of her head given no headaches or evidence of trauma to suggest intracranial hemorrhage.   No significant white count elevation to suggest infection Sodium is slightly low but patient has already given 1 L of fluids UA negative for infection.  Patient reports not taking her medications yet this morning for her Parkinson's.  She is not sure exactly what medication she is on and [facility]  Offered to have the pharmacist review her medications but husband states that he will go home and get the medications and bring them to the ER given they live so close.  We will give her her medication and wait 15 minutes given she states that is how long it typically takes for them to take effect and try to ambulate her to see if she feels like she is at her baseline self.  11:49 AM after taking patient's Parkinson's medications patient is able to ambulate states that she feels much stronger.  While walking to the room patient sitting on the edge of the bed with her legs  crossed.  Husband is in the room who states that patient is at her baseline self.  At this time before comfortable with going home and continue her medications and follow-up with her primary care doctor for blood pressure recheck.          ____________________________________________  FINAL CLINICAL IMPRESSION(S) / ED DIAGNOSES   Final diagnoses:  Weakness  Dehydration  Parkinson disease (HCC)      MEDICATIONS GIVEN DURING THIS VISIT:  Medications  lactated ringers bolus 1,000 mL (0 mLs Intravenous Stopped 09/22/20 1038)     ED Discharge Orders    None       Note:  This document was prepared using Dragon voice recognition software and may include unintentional dictation errors.   Concha Se, MD 09/22/20 [phone]

## 2020-09-22 NOTE — ED Notes (Addendum)
Pt able to stand with a walker. Dr. Fuller Plan, MD at bedside. Pt states that she doesn't think she can walk but she takes another medication at 10:50pm that will help her walk.

## 2020-09-22 NOTE — ED Notes (Signed)
Pt soiled with urine. New pad and brief placed on pt. Purewick placed on pt.

## 2020-09-22 NOTE — ED Provider Notes (Signed)
MSE was initiated and I personally evaluated the patient and placed orders (if any) at  6:28 AM on September 22, 2020.  The patient appears stable so that the remainder of the MSE may be completed by another provider.   Don Perking, [place], MD 09/22/20 [phone]

## 2020-09-22 NOTE — ED Notes (Signed)
Pt ambulatory around the room with a walker, pt states she feels better than when she came in.

## 2020-10-06 ENCOUNTER — Other Ambulatory Visit: Payer: Self-pay | Admitting: Internal Medicine

## 2020-10-31 ENCOUNTER — Other Ambulatory Visit: Payer: Self-pay | Admitting: Internal Medicine

## 2020-11-17 ENCOUNTER — Other Ambulatory Visit: Payer: Self-pay | Admitting: Internal Medicine

## 2020-12-12 ENCOUNTER — Other Ambulatory Visit: Payer: Self-pay | Admitting: Internal Medicine

## 2021-01-11 ENCOUNTER — Other Ambulatory Visit: Payer: Self-pay | Admitting: Internal Medicine

## 2021-02-11 ENCOUNTER — Other Ambulatory Visit: Payer: Self-pay | Admitting: Internal Medicine

## 2021-03-13 ENCOUNTER — Other Ambulatory Visit: Payer: Self-pay | Admitting: Internal Medicine

## 2021-04-05 ENCOUNTER — Other Ambulatory Visit: Payer: Self-pay

## 2021-04-05 ENCOUNTER — Emergency Department
Admission: EM | Admit: 2021-04-05 | Discharge: 2021-04-06 | Disposition: A | Discharge status: Home / Self Care | Payer: Medicare PPO | Attending: Emergency Medicine | Admitting: Emergency Medicine

## 2021-04-05 ENCOUNTER — Other Ambulatory Visit: Payer: Self-pay | Admitting: Internal Medicine

## 2021-04-05 DIAGNOSIS — I10 Essential (primary) hypertension: Secondary | ICD-10-CM | POA: Diagnosis not present

## 2021-04-05 DIAGNOSIS — R5383 Other fatigue: Secondary | ICD-10-CM | POA: Diagnosis not present

## 2021-04-05 DIAGNOSIS — R110 Nausea: Secondary | ICD-10-CM

## 2021-04-05 DIAGNOSIS — Z79899 Other long term (current) drug therapy: Secondary | ICD-10-CM | POA: Diagnosis not present

## 2021-04-05 DIAGNOSIS — R112 Nausea with vomiting, unspecified: Secondary | ICD-10-CM | POA: Insufficient documentation

## 2021-04-05 DIAGNOSIS — G20 Parkinson's disease: Secondary | ICD-10-CM | POA: Insufficient documentation

## 2021-04-05 LAB — CBC
HCT: 34.9 % — ABNORMAL LOW (ref 36.0–46.0)
Hemoglobin: 11.8 g/dL — ABNORMAL LOW (ref 12.0–15.0)
MCH: 33.8 pg (ref 26.0–34.0)
MCHC: 33.8 g/dL (ref 30.0–36.0)
MCV: 100 fL (ref 80.0–100.0)
Platelets: 227 10*3/uL (ref 150–400)
RBC: 3.49 MIL/uL — ABNORMAL LOW (ref 3.87–5.11)
RDW: 13.4 % (ref 11.5–15.5)
WBC: 10.9 10*3/uL — ABNORMAL HIGH (ref 4.0–10.5)
nRBC: 0 % (ref 0.0–0.2)

## 2021-04-05 LAB — COMPREHENSIVE METABOLIC PANEL
ALT: <5 U/L (ref 0–44)
AST: 17 U/L (ref 15–41)
Albumin: 3.6 g/dL (ref 3.5–5.0)
Alkaline Phosphatase: 76 U/L (ref 38–126)
Anion gap: 9 (ref 5–15)
BUN: 16 mg/dL (ref 8–23)
CO2: 25 mmol/L (ref 22–32)
Calcium: 9 mg/dL (ref 8.9–10.3)
Chloride: 105 mmol/L (ref 98–111)
Creatinine, Ser: 0.94 mg/dL (ref 0.44–1.00)
GFR, Estimated: >60 mL/min (ref >60)
Glucose, Bld: 111 mg/dL — ABNORMAL HIGH (ref 70–99)
Potassium: 3.7 mmol/L (ref 3.5–5.1)
Sodium: 139 mmol/L (ref 135–145)
Total Bilirubin: 0.7 mg/dL (ref 0.3–1.2)
Total Protein: 6.1 g/dL — ABNORMAL LOW (ref 6.5–8.1)

## 2021-04-05 LAB — URINALYSIS, ROUTINE W REFLEX MICROSCOPIC
Bilirubin Urine: NEGATIVE
Glucose, UA: NEGATIVE mg/dL
Hgb urine dipstick: NEGATIVE
Ketones, ur: 5 mg/dL — AB
Leukocytes,Ua: NEGATIVE
Nitrite: NEGATIVE
Protein, ur: NEGATIVE mg/dL
Specific Gravity, Urine: 1.006 (ref 1.005–1.030)
pH: 7 (ref 5.0–8.0)

## 2021-04-05 LAB — LIPASE, BLOOD: Lipase: 31 U/L (ref 11–51)

## 2021-04-05 MED ORDER — METOPROLOL TARTRATE 50 MG PO TABS
50.0000 mg | ORAL_TABLET | Freq: Once | ORAL | Status: AC
  Administered 2021-04-05: 50 mg via ORAL
  Filled 2021-04-05: qty 1

## 2021-04-05 MED ORDER — SODIUM CHLORIDE 0.9 % IV BOLUS
500.0000 mL | Freq: Once | INTRAVENOUS | Status: AC
  Administered 2021-04-05: 500 mL via INTRAVENOUS

## 2021-04-05 MED ORDER — ONDANSETRON HCL 4 MG/2ML IJ SOLN
4.0000 mg | INTRAMUSCULAR | Status: AC
  Administered 2021-04-05: 4 mg via INTRAVENOUS
  Filled 2021-04-05: qty 2

## 2021-04-05 MED ORDER — ONDANSETRON 4 MG PO TBDP
4.0000 mg | ORAL_TABLET | Freq: Once | ORAL | Status: AC | PRN
  Administered 2021-04-05: 4 mg via ORAL
  Filled 2021-04-05: qty 1

## 2021-04-05 NOTE — ED Notes (Signed)
Pt unwilling to take PO metoprolol tablet at this time. Offered to give with water or applesauce. Pt prefers to wait a few minutes. Will try again in a few minutes.

## 2021-04-05 NOTE — ED Notes (Signed)
Pt patient and Spouse - they do no feel comfortable with the patient going home via private vehicle due to her hx of Parkinsons. This RN will attempt to arrange a ride via EMS back to the patients residence.

## 2021-04-05 NOTE — ED Notes (Signed)
E-signature pad unavailable - Pt verbalized understanding of D/C information - no additional concerns at this time.  

## 2021-04-05 NOTE — ED Provider Notes (Signed)
[facility] Emergency Department Provider Note   ____________________________________________   Event Date/Time   First MD Initiated Contact with Patient 04/05/21 1649     (approximate)  I have reviewed the triage vital signs and the nursing notes.   HISTORY  Chief Complaint Nausea and Emesis    HPI Jasmine Mcdonald is a 70 y.o. female here with her husband.  Both provide history she saw a neurologist at [facility] yesterday.  She has been experiencing frequent episodes of nausea with a believer associated with her Parkinson's this has been an ongoing issue.  They decreased her medication going down on her carbidopa levodopa by about half, and her new prescription is being picked up today but she has not yet started this decreased dose  This morning she ate half of breakfast and began feeling nauseated and as usual fairly fatigued and weak.  She reports that she felt very weak to the point that she had a sit down or lay down on the floor.  She did not fall or become injured  Seems to have recurring episodes of this where she gets nauseated weak and fatigued and sometimes she is had to go to the hospital and will get better after receiving IV fluids Past Medical History:  Diagnosis Date   Anxiety    Hypertension    Parkinson's disease (HCC)    Thyroid disease     There are no problems to display for this patient.   Past Surgical History:  Procedure Laterality Date   CESAREAN SECTION     CHOLECYSTECTOMY     KNEE SURGERY      Prior to Admission medications   Medication Sig Start Date End Date Taking? Authorizing Provider  candesartan (ATACAND) 16 MG tablet TAKE 1 TABLET BY MOUTH EVERY DAY 04/05/21   Corky Downs, MD  levothyroxine (SYNTHROID) 100 MCG tablet TAKE 1 TABLET BY MOUTH EVERY DAY 06/06/20   Corky Downs, MD  metoprolol tartrate (LOPRESSOR) 50 MG tablet TAKE 1 TABLET BY MOUTH TWICE A DAY 06/06/20   Corky Downs, MD  ondansetron (ZOFRAN) 4 MG  tablet Take 1 tablet (4 mg total) by mouth every 8 (eight) hours as needed. 07/28/18   Phineas Semen, MD  traMADol (ULTRAM) 50 MG tablet Take 1 tablet (50 mg total) by mouth every 6 (six) hours as needed. 01/03/15   Chinita Pester, FNP    Allergies Patient has no known allergies.  No family history on file.  Social History Social History   Tobacco Use   Smoking status: Never   Smokeless tobacco: Never  Substance Use Topics   Alcohol use: No   Drug use: No    Review of Systems Constitutional: No fever/chills or recent illness Eyes: No visual changes. ENT: No sore throat. Cardiovascular: Denies chest pain. Respiratory: Denies shortness of breath. Gastrointestinal: No abdominal pain.  Symptoms are suddenly started to feel nauseated lightheaded fatigue.  Symptoms tend to come and go frequently.  She reports her neurologist thinks it has to do with her medications Genitourinary: Negative for dysuria. Musculoskeletal: Negative for back pain.  Feels weak in both of her legs but this is chronic according both her and her husband. Skin: Negative for rash. Neurological: Negative for headaches, areas of focal weakness or numbness.  Tremors due to Parkinson's  Has not taken her medications this morning including her metoprolol  ____________________________________________   PHYSICAL EXAM:  VITAL SIGNS: ED Triage Vitals  Enc Vitals Group     BP  04/05/21 1211 (!) 141/106     Pulse Rate 04/05/21 1211 86     Resp 04/05/21 1211 20     Temp 04/05/21 1211 98.4 F (36.9 C)     Temp Source 04/05/21 1211 Oral     SpO2 04/05/21 1211 96 %     Weight 04/05/21 1215 190 lb 0.6 oz (86.2 kg)     Height 04/05/21 1215 5\' 7"  (1.702 m)     Head Circumference --      Peak Flow --      Pain Score 04/05/21 1215 0     Pain Loc --      Pain Edu? --      Excl. in GC? --     Constitutional: Alert and oriented.  Fatigued and chronically ill-appearing but in no acute distress.  Husband very  pleasant as well as at the bedside Eyes: Conjunctivae are normal. Head: Atraumatic. Nose: No congestion/rhinnorhea. Mouth/Throat: Mucous membranes are slightly dry.  Patient reports she only had a slight amount of a bowl of cereal this morning Neck: No stridor.  Cardiovascular: Slightly tachycardic rate, regular rhythm. Grossly normal heart sounds.  Good peripheral circulation. Respiratory: Normal respiratory effort.  No retractions. Lungs CTAB. Gastrointestinal: Soft and nontender. No distention. Musculoskeletal: No lower extremity tenderness nor edema.  No bony or joint injuries.  Able to range all joints through good range of motion, however demonstrates about 4+ strength in the lower extremities bilaterally. Neurologic:  Normal speech and language. No gross focal neurologic deficits are appreciated.  Mild parkinsonian tremor noted Skin:  Skin is warm, dry and intact. No rash noted. Psychiatric: Mood and affect are normal. Speech and behavior are normal.  ____________________________________________   LABS (all labs ordered are listed, but only abnormal results are displayed)  Labs Reviewed  COMPREHENSIVE METABOLIC PANEL - Abnormal; Notable for the following components:      Result Value   Glucose, Bld 111 (*)    Total Protein 6.1 (*)    All other components within normal limits  CBC - Abnormal; Notable for the following components:   WBC 10.9 (*)    RBC 3.49 (*)    Hemoglobin 11.8 (*)    HCT 34.9 (*)    All other components within normal limits  URINALYSIS, ROUTINE W REFLEX MICROSCOPIC - Abnormal; Notable for the following components:   Color, Urine STRAW (*)    APPearance CLEAR (*)    Ketones, ur 5 (*)    All other components within normal limits  LIPASE, BLOOD   ____________________________________________  EKG  Reviewed interpreted by me Heart rate 95 Right bundle branch block.  No noted evidence of acute  ischemia ____________________________________________  RADIOLOGY  No new indication for imaging.  Very reassuring examination at this time.  No concerns or exam findings concerning for acute or central neurologic process.  Denies associated chest pain or shortness of breath.  No associated abdominal pain rather than did notes intermittent nausea but no associated abdominal pain ____________________________________________   PROCEDURES  Procedure(s) performed: None  Procedures  Critical Care performed: No  ____________________________________________   INITIAL IMPRESSION / ASSESSMENT AND PLAN / ED COURSE  Pertinent labs & imaging results that were available during my care of the patient were reviewed by me and considered in my medical decision making (see chart for details).       ----------------------------------------- 10:22 PM on 04/05/2021 ----------------------------------------- Patient resting has been able to eat drink few glasses of water feels much improved.  Is  able to eat [employer].  Husband reports that this is happened quite a few times, patient reports that she feels back to normal now and like to be able to go home.  She is resting comfortably fully awake alert without complaint of pain or discomfort.  Vital signs normal with exception to moderate hypertension  Does not ambulate safely for long distances and husband reports transportation barrier.  They specifically requesting ambulance transport back to their home, and to discuss with them that this transport may be deemed not medically necessary, but nonetheless they report they are comfortable with transport via ambulance only to home.  We will request transport via EMS, denoted not felt acutely medically necessary.  Return precautions and treatment recommendations and follow-up discussed with the patient who is agreeable with the plan.  ____________________________________________   FINAL CLINICAL  IMPRESSION(S) / ED DIAGNOSES  Final diagnoses:  Nausea  Parkinson disease (HCC)        Note:  This document was prepared using Dragon voice recognition software and may include unintentional dictation errors       Sharyn Creamer, MD 04/06/21 0142

## 2021-04-05 NOTE — ED Notes (Signed)
Pt tolerated PO challenge - Ok'd by Dr. Fanny Bien to take her home Rytary medication which was tolerated.

## 2021-04-05 NOTE — ED Triage Notes (Signed)
Pt comes into the ED via EMS from home with c/o worsening parkinsons, pt has uncontrolled jerking movements with N/V over the past couple of days  140 s 80HR 98%RA

## 2021-04-05 NOTE — ED Notes (Signed)
Pt provided water and crackers for PO challenge  

## 2021-04-05 NOTE — ED Notes (Signed)
EDP at bedside  

## 2021-04-05 NOTE — ED Notes (Signed)
See triage note. Pt and husband state pt has been vomiting intermittently for past few months. Hard to pinpoint time it started or how many times per day. They have been using zofran at home which helps temporarily. They were seen by PCP at [facility] yesterday because they were concerned that PD meds may be causing this and dosage was decreased but they have not picked up new Rx yet. Pt also has not taken BP meds today because cannot hold anything down. Last oral intake was this morning.

## 2021-04-05 NOTE — ED Triage Notes (Signed)
Pt comes into the ED via POV c/o nausea and vomiting.  Pt does have Parkinsons and they have changed her medication dose because the higher dose was causing the vomiting.  Pt has been unable to pick up lower dosage medication at this time.  Pt has uncontrollable tremors at baseline.  Pt does take zofran at home, but states she did not take it this morning.

## 2021-04-05 NOTE — ED Notes (Signed)
[facility] to transport home. 

## 2021-05-09 ENCOUNTER — Other Ambulatory Visit: Payer: Self-pay | Admitting: Internal Medicine

## 2021-06-01 ENCOUNTER — Other Ambulatory Visit: Payer: Self-pay | Admitting: Internal Medicine

## 2021-06-19 ENCOUNTER — Ambulatory Visit: Payer: Medicare PPO | Admitting: Internal Medicine

## 2021-06-21 ENCOUNTER — Other Ambulatory Visit: Payer: Self-pay

## 2021-06-21 ENCOUNTER — Ambulatory Visit: Payer: Medicare PPO | Admitting: Internal Medicine

## 2021-06-21 ENCOUNTER — Encounter: Payer: Self-pay | Admitting: Internal Medicine

## 2021-06-21 ENCOUNTER — Ambulatory Visit (INDEPENDENT_AMBULATORY_CARE_PROVIDER_SITE_OTHER): Payer: Medicare PPO | Admitting: *Deleted

## 2021-06-21 ENCOUNTER — Ambulatory Visit (INDEPENDENT_AMBULATORY_CARE_PROVIDER_SITE_OTHER): Payer: Medicare PPO | Admitting: Internal Medicine

## 2021-06-21 VITALS — BP 147/72 | HR 58

## 2021-06-21 DIAGNOSIS — Z23 Encounter for immunization: Secondary | ICD-10-CM

## 2021-06-21 DIAGNOSIS — Z0000 Encounter for general adult medical examination without abnormal findings: Secondary | ICD-10-CM

## 2021-06-21 DIAGNOSIS — G20 Parkinson's disease: Secondary | ICD-10-CM

## 2021-06-21 NOTE — Progress Notes (Signed)
Subjective:   Jasmine Mcdonald is a 71 y.o. female who presents for an Initial Medicare Annual Wellness Visit.  I discussed the limitations of evaluation and management by telemedicine and the availability of in person appointments. Patient expressed understanding and agreed to proceed.   Visit performed using audio    Review of Systems    Defer to provider        Objective:    Today's Vitals   06/21/21 1413  PainSc: 2    There is no height or weight on file to calculate BMI.  Advanced Directives 06/21/2021 04/05/2021 09/22/2020 07/28/2018 02/10/2017 03/14/2015 01/03/2015  Does Patient Have a Medical Advance Directive? No No No No No No No  Would patient like information on creating a medical advance directive? No - Patient declined - No - Patient declined No - Patient declined No - Patient declined No - patient declined information Yes - Educational materials given    Current Medications (verified) Outpatient Encounter Medications as of 06/21/2021  Medication Sig   candesartan (ATACAND) 16 MG tablet TAKE 1 TABLET BY MOUTH EVERY DAY   Carbidopa-Levodopa ER 48.75-195 MG CPCR Take 48.75-195 mg by mouth in the morning, at noon, in the evening, and at bedtime.   levothyroxine (SYNTHROID) 100 MCG tablet TAKE 1 TABLET BY MOUTH EVERY DAY   LORazepam (ATIVAN) 0.5 MG tablet Take 0.5 mg by mouth 2 (two) times daily.   metoprolol tartrate (LOPRESSOR) 50 MG tablet TAKE 1 TABLET BY MOUTH TWICE A DAY   ondansetron (ZOFRAN) 4 MG tablet Take 1 tablet (4 mg total) by mouth every 8 (eight) hours as needed.   sertraline (ZOLOFT) 100 MG tablet Take 150 mg by mouth daily.   traMADol (ULTRAM) 50 MG tablet Take 1 tablet (50 mg total) by mouth every 6 (six) hours as needed.   No facility-administered encounter medications on file as of 06/21/2021.    Allergies (verified) Patient has no known allergies.   History: Past Medical History:  Diagnosis Date   Anxiety    Hypertension    Parkinson's  disease (HCC)    Thyroid disease    Past Surgical History:  Procedure Laterality Date   CESAREAN SECTION     CHOLECYSTECTOMY     KNEE SURGERY     History reviewed. No pertinent family history. Social History   Socioeconomic History   Marital status: Married    Spouse name: Not on file   Number of children: Not on file   Years of education: Not on file   Highest education level: Master's degree (e.g., MA, MS, MEng, MEd, MSW, MBA)  Occupational History   Not on file  Tobacco Use   Smoking status: Never   Smokeless tobacco: Never  Substance and Sexual Activity   Alcohol use: No   Drug use: No   Sexual activity: Not on file  Other Topics Concern   Not on file  Social History Narrative   Not on file   Social Determinants of Health   Financial Resource Strain: Low Risk    Difficulty of Paying Living Expenses: Not hard at all  Food Insecurity: No Food Insecurity   Worried About Programme researcher, broadcasting/film/video in the Last Year: Never true   Ran Out of Food in the Last Year: Never true  Transportation Needs: No Transportation Needs   Lack of Transportation (Medical): No   Lack of Transportation (Non-Medical): No  Physical Activity: Inactive   Days of Exercise per Week: 0 days  Minutes of Exercise per Session: 0 min  Stress: Stress Concern Present   Feeling of Stress : To some extent  Social Connections: Moderately Integrated   Frequency of Communication with Friends and Family: More than three times a week   Frequency of Social Gatherings with Friends and Family: More than three times a week   Attends Religious Services: More than 4 times per year   Active Member of [employer] Financial[employer]ubs or Organizations: No   Attends Engineer, structuralClub or Organization Meetings: Never   Marital Status: Married    Tobacco Counseling Counseling given: Not Answered   Clinical Intake:  Pre-visit preparation completed: Yes  Pain : 0-10 Pain Score: 2  Pain Location: Other (Comment) (wrist) Pain Orientation: Left      Diabetes: No  How often do you need to have someone help you when you read instructions, pamphlets, or other written materials from your doctor or pharmacy?: 3 - Sometimes What is the last grade level you completed in school?: colledge  Diabetic?no   Interpreter Needed?: No  Information entered by :: Melody ComasAshley Gregori Abril, CMA   Activities of Daily Living In your present state of health, do you have any difficulty performing the following activities: 06/21/2021  Hearing? N  Vision? N  Difficulty concentrating or making decisions? Y  Comment sometimes  Walking or climbing stairs? Y  Comment patient has parkinsons  Dressing or bathing? Y  Comment sometimes  Doing errands, shopping? Y  Preparing Food and eating ? Y  Using the Toilet? Y  In the past six months, have you accidently leaked urine? Y  Do you have problems with loss of bowel control? Y  Managing your Finances? Y  Housekeeping or managing your Housekeeping? Y  Some recent data might be hidden    Patient Care Team: Corky DownsMasoud, Javed, MD as PCP - General (Internal Medicine)  Indicate any recent Medical Services you may have received from other than [facility] providers in the past year (date may be approximate).     Assessment:   This is a routine wellness examination for [place].  Hearing/Vision screen No results found.  Dietary issues and exercise activities discussed: Current Exercise Habits: The patient does not participate in regular exercise at present, Exercise limited by: Other - see comments (patient has parkinsons)   Goals Addressed   None    Depression Screen PHQ 2/9 Scores 06/21/2021  PHQ - 2 Score 2    Fall Risk Fall Risk  06/21/2021  Falls in the past year? 1  Number falls in past yr: 1  Injury with Fall? 0  Risk for fall due to : Impaired balance/gait;History of fall(s)  Follow up Falls evaluation completed    FALL RISK PREVENTION PERTAINING TO THE HOME:  Any stairs in or around the home? Yes  If  so, are there any without handrails? Yes  Home free of loose throw rugs in walkways, pet beds, electrical cords, etc? Yes  Adequate lighting in your home to reduce risk of falls? Yes   ASSISTIVE DEVICES UTILIZED TO PREVENT FALLS:  Life alert? No  Use of a cane, walker or w/c? Yes  Grab bars in the bathroom? Yes  Shower chair or bench in shower? Yes  Elevated toilet seat or a handicapped toilet? Yes   TIMED UP AND GO:  Was the test performed? No .  Length of time to ambulate-    Cognitive Function: MMSE - Mini Mental State Exam 06/21/2021  Not completed: Unable to complete     6CIT  Screen 06/21/2021  What Year? 0 points  What month? 0 points  What time? 0 points  Count back from 20 0 points  Months in reverse 0 points  Repeat phrase 4 points  Total Score 4    Immunizations  There is no immunization history on file for this patient.  TDAP status: Due, Education has been provided regarding the importance of this vaccine. Advised may receive this vaccine at local pharmacy or Health Dept. Aware to provide a copy of the vaccination record if obtained from local pharmacy or Health Dept. Verbalized acceptance and understanding.  Flu Vaccine status: Completed at today's visit  Pneumococcal vaccine status: Declined,  Education has been provided regarding the importance of this vaccine but patient still declined. Advised may receive this vaccine at local pharmacy or Health Dept. Aware to provide a copy of the vaccination record if obtained from local pharmacy or Health Dept. Verbalized acceptance and understanding.   Covid-19 vaccine status: Completed vaccines  Qualifies for Shingles Vaccine? Yes   Zostavax completed No   Shingrix Completed?: No.    Education has been provided regarding the importance of this vaccine. Patient has been advised to call insurance company to determine out of pocket expense if they have not yet received this vaccine. Advised may also receive vaccine at  local pharmacy or Health Dept. Verbalized acceptance and understanding.  Screening Tests Health Maintenance  Topic Date Due   COVID-19 Vaccine (1) Never done   Hepatitis C Screening  Never done   TETANUS/TDAP  Never done   INFLUENZA VACCINE  01/02/2021   Zoster Vaccines- Shingrix (1 of 2) 09/19/2021 (Originally 03/04/2003)   Pneumonia Vaccine 72+ Years old (1 - PCV) 06/21/2022 (Originally 03/03/2018)   MAMMOGRAM  06/21/2022 (Originally 03/04/2003)   DEXA SCAN  06/21/2022 (Originally 03/03/2018)   COLONOSCOPY (Pts 45-30yrs Insurance coverage will need to be confirmed)  06/21/2022 (Originally 03/03/1998)   HPV VACCINES  Aged Out    Health Maintenance  Health Maintenance Due  Topic Date Due   COVID-19 Vaccine (1) Never done   Hepatitis C Screening  Never done   TETANUS/TDAP  Never done   INFLUENZA VACCINE  01/02/2021    Patient declined colonoscopy   Patient declined mammogram     Lung Cancer Screening: (Low Dose CT Chest recommended if Age 38-80 years, 30 pack-year currently smoking OR have quit w/in 15years.) does not qualify.   Lung Cancer Screening Referral: NA   Additional Screening:  Hepatitis C Screening: does qualify; will complete with next labs   Vision Screening: Recommended annual ophthalmology exams for early detection of glaucoma and other disorders of the eye. Is the patient up to date with their annual eye exam?  No  Who is the provider or what is the name of the office in which the patient attends annual eye exams? [facility]  If pt is not established with a provider, would they like to be referred to a provider to establish care? No .   Dental Screening: Recommended annual dental exams for proper oral hygiene  Community Resource Referral / Chronic Care Management: CRR required this visit?  No   CCM required this visit?  No      Plan:     I have personally reviewed and noted the following in the patients chart:   Medical and social history Use  of alcohol, tobacco or illicit drugs  Current medications and supplements including opioid prescriptions. Patient is not currently taking opioid prescriptions. Functional ability and status  Nutritional status Physical activity Advanced directives List of other physicians Hospitalizations, surgeries, and ER visits in previous 12 months Vitals Screenings to include cognitive, depression, and falls Referrals and appointments  In addition, I have reviewed and discussed with patient certain preventive protocols, quality metrics, and best practice recommendations. A written personalized care plan for preventive services as well as general preventive health recommendations were provided to patient.     Melody Comasshley Kye Silverstein, [place]MA   06/21/2021   Nurse Notes:  Ms. Marca AnconaGilmore , Thank you for taking time to come for your Medicare Wellness Visit. I appreciate your ongoing commitment to your health goals. Please review the following plan we discussed and let me know if I can assist you in the future.   These are the goals we discussed:  Goals   None     This is a list of the screening recommended for you and due dates:  Health Maintenance  Topic Date Due   COVID-19 Vaccine (1) Never done   Hepatitis C Screening: USPSTF Recommendation to screen - Ages 1518-79 yo.  Never done   Tetanus Vaccine  Never done   Flu Shot  01/02/2021   Zoster (Shingles) Vaccine (1 of 2) 09/19/2021*   Pneumonia Vaccine (1 - PCV) 06/21/2022*   Mammogram  06/21/2022*   DEXA scan (bone density measurement)  06/21/2022*   Colon Cancer Screening  06/21/2022*   HPV Vaccine  Aged Out  *Topic was postponed. The date shown is not the original due date.

## 2021-06-22 DIAGNOSIS — G20 Parkinson's disease: Secondary | ICD-10-CM | POA: Insufficient documentation

## 2021-06-22 NOTE — Progress Notes (Signed)
Established Patient Office Visit  Subjective:  Patient ID: Jasmine Mcdonald, female    DOB: 1951-04-28  Age: 71 y.o. MRN: [id number]  CC:  Chief Complaint  Patient presents with   Follow-up    HPI  Jasmine Mcdonald presents for general check up  Past Medical History:  Diagnosis Date   Anxiety    Hypertension    Parkinson's disease (HCC)    Thyroid disease     Past Surgical History:  Procedure Laterality Date   CESAREAN SECTION     CHOLECYSTECTOMY     KNEE SURGERY      History reviewed. No pertinent family history.  Social History   Socioeconomic History   Marital status: Married    Spouse name: Not on file   Number of children: Not on file   Years of education: Not on file   Highest education level: Master's degree (e.g., MA, MS, MEng, MEd, MSW, MBA)  Occupational History   Not on file  Tobacco Use   Smoking status: Never   Smokeless tobacco: Never  Substance and Sexual Activity   Alcohol use: No   Drug use: No   Sexual activity: Not on file  Other Topics Concern   Not on file  Social History Narrative   Not on file   Social Determinants of Health   Financial Resource Strain: Low Risk    Difficulty of Paying Living Expenses: Not hard at all  Food Insecurity: No Food Insecurity   Worried About Programme researcher, broadcasting/film/video in the Last Year: Never true   Ran Out of Food in the Last Year: Never true  Transportation Needs: No Transportation Needs   Lack of Transportation (Medical): No   Lack of Transportation (Non-Medical): No  Physical Activity: Inactive   Days of Exercise per Week: 0 days   Minutes of Exercise per Session: 0 min  Stress: Stress Concern Present   Feeling of Stress : To some extent  Social Connections: Moderately Integrated   Frequency of Communication with Friends and Family: More than three times a week   Frequency of Social Gatherings with Friends and Family: More than three times a week   Attends Religious Services: More than 4 times  per year   Active Member of [employer] or Organizations: No   Attends Banker Meetings: Never   Marital Status: Married  Catering manager Violence: Not At Risk   Fear of Current or Ex-Partner: No   Emotionally Abused: No   Physically Abused: No   Sexually Abused: No     Current Outpatient Medications:    candesartan (ATACAND) 16 MG tablet, TAKE 1 TABLET BY MOUTH EVERY DAY, Disp: 30 tablet, Rfl: 0   Carbidopa-Levodopa ER 48.75-195 MG CPCR, Take 48.75-195 mg by mouth in the morning, at noon, in the evening, and at bedtime., Disp: , Rfl:    levothyroxine (SYNTHROID) 100 MCG tablet, TAKE 1 TABLET BY MOUTH EVERY DAY, Disp: 90 tablet, Rfl: 4   LORazepam (ATIVAN) 0.5 MG tablet, Take 0.5 mg by mouth 2 (two) times daily., Disp: , Rfl:    metoprolol tartrate (LOPRESSOR) 50 MG tablet, TAKE 1 TABLET BY MOUTH TWICE A DAY, Disp: 180 tablet, Rfl: 4   ondansetron (ZOFRAN) 4 MG tablet, Take 1 tablet (4 mg total) by mouth every 8 (eight) hours as needed., Disp: 20 tablet, Rfl: 0   sertraline (ZOLOFT) 100 MG tablet, Take 150 mg by mouth daily., Disp: , Rfl:    traMADol (ULTRAM) 50 MG tablet,  Take 1 tablet (50 mg total) by mouth every 6 (six) hours as needed., Disp: 9 tablet, Rfl: 0   No Known Allergies  ROS Review of Systems  Constitutional:  Positive for fatigue. Negative for chills and diaphoresis.  HENT: Negative.  Negative for congestion and hearing loss.   Eyes: Negative.  Negative for discharge.  Respiratory: Negative.  Negative for cough and choking.   Cardiovascular: Negative.  Negative for chest pain.  Gastrointestinal: Negative.  Negative for abdominal pain.  Endocrine: Negative.   Genitourinary: Negative.  Negative for urgency.  Musculoskeletal:  Positive for arthralgias and gait problem. Negative for neck stiffness.  Skin: Negative.   Allergic/Immunologic: Negative.   Hematological: Negative.   Psychiatric/Behavioral:  Positive for sleep disturbance. Negative for agitation,  behavioral problems, confusion, dysphoric mood, hallucinations, self-injury and suicidal ideas. The patient is nervous/anxious and is hyperactive.   All other systems reviewed and are negative.    Objective:    Physical Exam Musculoskeletal:     Comments: Patient is shaky, has problem with coordination, she is wheelchair-bound, she has parkinsonian tremor     Neurological:     Mental Status: She is oriented to person, place, and time.     Motor: Weakness present.  Psychiatric:        Mood and Affect: Mood normal.        Behavior: Behavior normal.        Thought Content: Thought content normal.        Judgment: Judgment normal.    BP (!) 147/72    Pulse (!) 58  Wt Readings from Last 3 Encounters:  04/05/21 190 lb 0.6 oz (86.2 kg)  09/22/20 190 lb 0.6 oz (86.2 kg)  07/28/18 177 lb (80.3 kg)     Health Maintenance Due  Topic Date Due   COVID-19 Vaccine (1) Never done   Hepatitis C Screening  Never done   TETANUS/TDAP  Never done    There are no preventive care reminders to display for this patient.  No results found for: TSH Lab Results  Component Value Date   WBC 10.9 (H) 04/05/2021   HGB 11.8 (L) 04/05/2021   HCT 34.9 (L) 04/05/2021   MCV 100.0 04/05/2021   PLT 227 04/05/2021   Lab Results  Component Value Date   NA 139 04/05/2021   K 3.7 04/05/2021   CO2 25 04/05/2021   GLUCOSE 111 (H) 04/05/2021   BUN 16 04/05/2021   CREATININE 0.94 04/05/2021   BILITOT 0.7 04/05/2021   ALKPHOS 76 04/05/2021   AST 17 04/05/2021   ALT <5 04/05/2021   PROT 6.1 (L) 04/05/2021   ALBUMIN 3.6 04/05/2021   CALCIUM 9.0 04/05/2021   ANIONGAP 9 04/05/2021   No results found for: CHOL No results found for: HDL No results found for: LDLCALC No results found for: TRIG No results found for: CHOLHDL No results found for: BSWH6P    Assessment & Plan:   Problem List Items Addressed This Visit   None   No orders of the defined types were placed in this  encounter.   Follow-up: No follow-ups on file.    Jasmine Downs, MD

## 2021-06-22 NOTE — Assessment & Plan Note (Signed)
Patient is wheelchair-bound she is nervous and has shaking tremors, family wants her to be placed in a nursing home, she is agreeable for that.

## 2021-07-04 ENCOUNTER — Encounter: Payer: Self-pay | Admitting: Internal Medicine

## 2021-07-04 NOTE — Assessment & Plan Note (Signed)
Parkinsonian disease is stable, heart is regular chest is clear abdomen is soft nontender without any hepatosplenomegaly megaly

## 2021-07-04 NOTE — Progress Notes (Signed)
Established Patient Office Visit  Subjective:  Patient ID: Jasmine Mcdonald, female    DOB: [birth year]  Age: 71 y.o. MRN: [id number]  CC: No chief complaint on file.   HPI   [place] presents for check up  Past Medical History:  Diagnosis Date   Anxiety    Hypertension    Parkinson's disease (HCC)    Thyroid disease     Past Surgical History:  Procedure Laterality Date   CESAREAN SECTION     CHOLECYSTECTOMY     KNEE SURGERY      History reviewed. No pertinent family history.  Social History   Socioeconomic History   Marital status: Married    Spouse name: Not on file   Number of children: Not on file   Years of education: Not on file   Highest education level: Master's degree (e.g., MA, MS, MEng, MEd, MSW, MBA)  Occupational History   Not on file  Tobacco Use   Smoking status: Never   Smokeless tobacco: Never  Substance and Sexual Activity   Alcohol use: No   Drug use: No   Sexual activity: Not on file  Other Topics Concern   Not on file  Social History Narrative   Not on file   Social Determinants of Health   Financial Resource Strain: Low Risk    Difficulty of Paying Living Expenses: Not hard at all  Food Insecurity: No Food Insecurity   Worried About Programme researcher, broadcasting/film/video in the Last Year: Never true   Ran Out of Food in the Last Year: Never true  Transportation Needs: No Transportation Needs   Lack of Transportation (Medical): No   Lack of Transportation (Non-Medical): No  Physical Activity: Inactive   Days of Exercise per Week: 0 days   Minutes of Exercise per Session: 0 min  Stress: Stress Concern Present   Feeling of Stress : To some extent  Social Connections: Moderately Integrated   Frequency of Communication with Friends and Family: More than three times a week   Frequency of Social Gatherings with Friends and Family: More than three times a week   Attends Religious Services: More than 4 times per year   Active Member of [employer] or  Organizations: No   Attends Banker Meetings: Never   Marital Status: Married  Catering manager Violence: Not At Risk   Fear of Current or Ex-Partner: No   Emotionally Abused: No   Physically Abused: No   Sexually Abused: No     Current Outpatient Medications:    candesartan (ATACAND) 16 MG tablet, TAKE 1 TABLET BY MOUTH EVERY DAY, Disp: 30 tablet, Rfl: 0   Carbidopa-Levodopa ER 48.75-195 MG CPCR, Take 48.75-195 mg by mouth in the morning, at noon, in the evening, and at bedtime., Disp: , Rfl:    levothyroxine (SYNTHROID) 100 MCG tablet, TAKE 1 TABLET BY MOUTH EVERY DAY, Disp: 90 tablet, Rfl: 4   LORazepam (ATIVAN) 0.5 MG tablet, Take 0.5 mg by mouth 2 (two) times daily., Disp: , Rfl:    metoprolol tartrate (LOPRESSOR) 50 MG tablet, TAKE 1 TABLET BY MOUTH TWICE A DAY, Disp: 180 tablet, Rfl: 4   ondansetron (ZOFRAN) 4 MG tablet, Take 1 tablet (4 mg total) by mouth every 8 (eight) hours as needed., Disp: 20 tablet, Rfl: 0   sertraline (ZOLOFT) 100 MG tablet, Take 150 mg by mouth daily., Disp: , Rfl:    traMADol (ULTRAM) 50 MG tablet, Take 1 tablet (50 mg total) by  mouth every 6 (six) hours as needed., Disp: 9 tablet, Rfl: 0   No Known Allergies  ROS Review of Systems  Constitutional: Negative.   HENT: Negative.    Eyes: Negative.   Respiratory: Negative.    Cardiovascular: Negative.   Gastrointestinal: Negative.   Endocrine: Negative.   Genitourinary: Negative.   Musculoskeletal: Negative.   Skin: Negative.   Allergic/Immunologic: Negative.   Neurological: Negative.   Hematological: Negative.   Psychiatric/Behavioral: Negative.    All other systems reviewed and are negative.    Objective:    Physical Exam Vitals reviewed.  Constitutional:      Appearance: Normal appearance.  HENT:     Mouth/Throat:     Mouth: Mucous membranes are moist.  Eyes:     Pupils: Pupils are equal, round, and reactive to light.  Neck:     Vascular: No carotid bruit.   Cardiovascular:     Rate and Rhythm: Normal rate and regular rhythm.     Pulses: Normal pulses.     Heart sounds: Normal heart sounds.  Pulmonary:     Effort: Pulmonary effort is normal.     Breath sounds: Normal breath sounds.  Abdominal:     General: Bowel sounds are normal.     Palpations: Abdomen is soft. There is no hepatomegaly, splenomegaly or mass.     Tenderness: There is no abdominal tenderness.     Hernia: No hernia is present.  Musculoskeletal:        General: No tenderness.     Cervical back: Neck supple.     Right lower leg: No edema.     Left lower leg: No edema.  Skin:    Findings: No rash.  Neurological:     Mental Status: She is alert and oriented to person, place, and time.     Motor: No weakness.  Psychiatric:        Mood and Affect: Mood and affect normal.        Behavior: Behavior normal.    There were no vitals taken for this visit. Wt Readings from Last 3 Encounters:  04/05/21 190 lb 0.6 oz (86.2 kg)  09/22/20 190 lb 0.6 oz (86.2 kg)  07/28/18 177 lb (80.3 kg)     Health Maintenance Due  Topic Date Due   COVID-19 Vaccine (1) Never done   Hepatitis C Screening  Never done   TETANUS/TDAP  Never done    There are no preventive care reminders to display for this patient.  No results found for: TSH Lab Results  Component Value Date   WBC 10.9 (H) 04/05/2021   HGB 11.8 (L) 04/05/2021   HCT 34.9 (L) 04/05/2021   MCV 100.0 04/05/2021   PLT 227 04/05/2021   Lab Results  Component Value Date   NA 139 04/05/2021   K 3.7 04/05/2021   CO2 25 04/05/2021   GLUCOSE 111 (H) 04/05/2021   BUN 16 04/05/2021   CREATININE 0.94 04/05/2021   BILITOT 0.7 04/05/2021   ALKPHOS 76 04/05/2021   AST 17 04/05/2021   ALT <5 04/05/2021   PROT 6.1 (L) 04/05/2021   ALBUMIN 3.6 04/05/2021   CALCIUM 9.0 04/05/2021   ANIONGAP 9 04/05/2021   No results found for: CHOL No results found for: HDL No results found for: LDLCALC No results found for: TRIG No  results found for: CHOLHDL No results found for: [id number]    Assessment & Plan:   Problem List Items Addressed This Visit  Nervous and Auditory   Parkinson's disease (HCC) - Primary    Parkinsonian disease is stable, heart is regular chest is clear abdomen is soft nontender without any hepatosplenomegaly megaly       No orders of the defined types were placed in this encounter.   Follow-up: No follow-ups on file.    Corky Downs, MD

## 2021-07-05 ENCOUNTER — Other Ambulatory Visit: Payer: Self-pay | Admitting: Internal Medicine

## 2021-07-13 ENCOUNTER — Other Ambulatory Visit (INDEPENDENT_AMBULATORY_CARE_PROVIDER_SITE_OTHER): Payer: Medicare PPO

## 2021-07-13 DIAGNOSIS — Z111 Encounter for screening for respiratory tuberculosis: Secondary | ICD-10-CM | POA: Diagnosis not present

## 2021-07-13 LAB — TB SKIN TEST
Induration: 0 mm
TB Skin Test: NEGATIVE

## 2021-07-24 ENCOUNTER — Encounter: Payer: Self-pay | Admitting: Emergency Medicine

## 2021-07-24 ENCOUNTER — Other Ambulatory Visit: Payer: Self-pay

## 2021-07-24 DIAGNOSIS — Z20822 Contact with and (suspected) exposure to covid-19: Secondary | ICD-10-CM | POA: Insufficient documentation

## 2021-07-24 DIAGNOSIS — E860 Dehydration: Secondary | ICD-10-CM | POA: Insufficient documentation

## 2021-07-24 DIAGNOSIS — R443 Hallucinations, unspecified: Secondary | ICD-10-CM | POA: Insufficient documentation

## 2021-07-24 DIAGNOSIS — R4182 Altered mental status, unspecified: Secondary | ICD-10-CM | POA: Diagnosis not present

## 2021-07-24 DIAGNOSIS — G20 Parkinson's disease: Secondary | ICD-10-CM | POA: Diagnosis not present

## 2021-07-24 DIAGNOSIS — I10 Essential (primary) hypertension: Secondary | ICD-10-CM | POA: Diagnosis not present

## 2021-07-24 DIAGNOSIS — F23 Brief psychotic disorder: Secondary | ICD-10-CM | POA: Diagnosis not present

## 2021-07-24 DIAGNOSIS — Z79899 Other long term (current) drug therapy: Secondary | ICD-10-CM | POA: Insufficient documentation

## 2021-07-24 LAB — COMPREHENSIVE METABOLIC PANEL
ALT: <5 U/L (ref 0–44)
AST: 14 U/L — ABNORMAL LOW (ref 15–41)
Albumin: 4.7 g/dL (ref 3.5–5.0)
Alkaline Phosphatase: 92 U/L (ref 38–126)
Anion gap: 11 (ref 5–15)
BUN: 17 mg/dL (ref 8–23)
CO2: 27 mmol/L (ref 22–32)
Calcium: 10.1 mg/dL (ref 8.9–10.3)
Chloride: 99 mmol/L (ref 98–111)
Creatinine, Ser: 1.26 mg/dL — ABNORMAL HIGH (ref 0.44–1.00)
GFR, Estimated: 47 mL/min — ABNORMAL LOW (ref >60)
Glucose, Bld: 132 mg/dL — ABNORMAL HIGH (ref 70–99)
Potassium: 3.8 mmol/L (ref 3.5–5.1)
Sodium: 137 mmol/L (ref 135–145)
Total Bilirubin: 0.7 mg/dL (ref 0.3–1.2)
Total Protein: 7.4 g/dL (ref 6.5–8.1)

## 2021-07-24 LAB — CBC WITH DIFFERENTIAL/PLATELET
Abs Immature Granulocytes: 0.03 10*3/uL (ref 0.00–0.07)
Basophils Absolute: 0 10*3/uL (ref 0.0–0.1)
Basophils Relative: 0 %
Eosinophils Absolute: 0 10*3/uL (ref 0.0–0.5)
Eosinophils Relative: 0 %
HCT: 44.7 % (ref 36.0–46.0)
Hemoglobin: 14.4 g/dL (ref 12.0–15.0)
Immature Granulocytes: 0 %
Lymphocytes Relative: 6 %
Lymphs Abs: 0.5 10*3/uL — ABNORMAL LOW (ref 0.7–4.0)
MCH: 31.6 pg (ref 26.0–34.0)
MCHC: 32.2 g/dL (ref 30.0–36.0)
MCV: 98 fL (ref 80.0–100.0)
Monocytes Absolute: 0.5 10*3/uL (ref 0.1–1.0)
Monocytes Relative: 5 %
Neutro Abs: 7.9 10*3/uL — ABNORMAL HIGH (ref 1.7–7.7)
Neutrophils Relative %: 89 %
Platelets: 283 10*3/uL (ref 150–400)
RBC: 4.56 MIL/uL (ref 3.87–5.11)
RDW: 13.7 % (ref 11.5–15.5)
WBC: 9 10*3/uL (ref 4.0–10.5)
nRBC: 0 % (ref 0.0–0.2)

## 2021-07-24 LAB — URINALYSIS, ROUTINE W REFLEX MICROSCOPIC
Bilirubin Urine: NEGATIVE
Glucose, UA: NEGATIVE mg/dL
Hgb urine dipstick: NEGATIVE
Ketones, ur: 20 mg/dL — AB
Nitrite: NEGATIVE
Protein, ur: 30 mg/dL — AB
Specific Gravity, Urine: 1.024 (ref 1.005–1.030)
pH: 5 (ref 5.0–8.0)

## 2021-07-24 NOTE — ED Triage Notes (Signed)
EMS brings pt in from [facility] for "change in mental status, anxiety, not wanting to take meds"; hx parkinsons

## 2021-07-24 NOTE — ED Provider Triage Note (Signed)
Emergency Medicine Provider Triage Evaluation Note  KARI MONTERO , a 71 y.o. female  was evaluated in triage.  Pt complains of increased shaking, change in mental status, refusal to take meds.  Patient.  Has a history of Parkinson's and dementia.  Reportedly had some anxiety and hallucinations tonight.  The daughter believes that her mother took her medications too close together increasing her symptoms.  Review of Systems  Positive: Altered mental status, anxiety, refusal to take her prescribed medications Negative: Chest pain, shortness of breath, fevers or chills, emesis, recent trauma  Physical Exam  BP (!) 153/97    Pulse 74    Temp 98.3 F (36.8 C) (Oral)    Resp 20    Ht 5\' 7"  (1.702 m)    Wt 63.5 kg    SpO2 99%    BMI 21.93 kg/m  Gen:   Awake, no distress   Resp:  Normal effort  MSK:   Moves extremities without difficulty  Other:    Medical Decision Making  Medically screening exam initiated at 10:03 PM.  Appropriate orders placed.  was informed that the remainder of the evaluation will be completed by another provider, this initial triage assessment does not replace that evaluation, and the importance of remaining in the ED until their evaluation is complete.  Patient arrives with altered mental status, possible anxiety/hallucinations, refusal to take medications.  Patient will have basic labs, urinalysis at this time.   [place], PA-C 07/24/21 2203

## 2021-07-24 NOTE — ED Triage Notes (Signed)
Pt presents via EMS from [facility] with complaints of Anxiety & Hallucinations. Per Daughter, she believes her Mom took her Rytary medication to close together causing her tremors to be more severe and she refused her night time medications tonight. Hx of Parkinsons.

## 2021-07-25 ENCOUNTER — Emergency Department: Payer: Medicare PPO

## 2021-07-25 ENCOUNTER — Emergency Department
Admission: EM | Admit: 2021-07-25 | Discharge: 2021-07-25 | Disposition: A | Discharge status: Home / Self Care | Payer: Medicare PPO | Attending: Emergency Medicine | Admitting: Emergency Medicine

## 2021-07-25 DIAGNOSIS — F919 Conduct disorder, unspecified: Secondary | ICD-10-CM

## 2021-07-25 DIAGNOSIS — F23 Brief psychotic disorder: Secondary | ICD-10-CM | POA: Diagnosis present

## 2021-07-25 DIAGNOSIS — R443 Hallucinations, unspecified: Secondary | ICD-10-CM

## 2021-07-25 DIAGNOSIS — G20 Parkinson's disease: Secondary | ICD-10-CM

## 2021-07-25 DIAGNOSIS — R4182 Altered mental status, unspecified: Secondary | ICD-10-CM

## 2021-07-25 DIAGNOSIS — E860 Dehydration: Secondary | ICD-10-CM

## 2021-07-25 LAB — RESP PANEL BY RT-PCR (FLU A&B, COVID) ARPGX2
Influenza A by PCR: NEGATIVE
Influenza B by PCR: NEGATIVE
SARS Coronavirus 2 by RT PCR: NEGATIVE

## 2021-07-25 LAB — TROPONIN I (HIGH SENSITIVITY): Troponin I (High Sensitivity): 12 ng/L (ref <18)

## 2021-07-25 MED ORDER — LORAZEPAM 0.5 MG PO TABS
0.5000 mg | ORAL_TABLET | Freq: Once | ORAL | Status: AC
  Administered 2021-07-25: 0.5 mg via ORAL
  Filled 2021-07-25: qty 1

## 2021-07-25 MED ORDER — METOPROLOL TARTRATE 50 MG PO TABS
50.0000 mg | ORAL_TABLET | Freq: Once | ORAL | Status: AC
  Administered 2021-07-25: 50 mg via ORAL
  Filled 2021-07-25: qty 1

## 2021-07-25 MED ORDER — SODIUM CHLORIDE 0.9 % IV BOLUS
1000.0000 mL | Freq: Once | INTRAVENOUS | Status: AC
  Administered 2021-07-25: 1000 mL via INTRAVENOUS

## 2021-07-25 MED ORDER — CARBIDOPA-LEVODOPA 25-100 MG PO TABS
0.5000 | ORAL_TABLET | Freq: Every day | ORAL | Status: DC | PRN
  Filled 2021-07-25: qty 0.5

## 2021-07-25 MED ORDER — IRBESARTAN 150 MG PO TABS
150.0000 mg | ORAL_TABLET | Freq: Every day | ORAL | Status: DC
  Administered 2021-07-25: 150 mg via ORAL
  Filled 2021-07-25: qty 1

## 2021-07-25 MED ORDER — CARBIDOPA-LEVODOPA 25-100 MG PO TABS
0.5000 | ORAL_TABLET | Freq: Three times a day (TID) | ORAL | Status: DC
  Administered 2021-07-25 (×2): 0.5 via ORAL
  Filled 2021-07-25 (×4): qty 0.5

## 2021-07-25 MED ORDER — LEVOTHYROXINE SODIUM 50 MCG PO TABS
100.0000 ug | ORAL_TABLET | Freq: Every day | ORAL | Status: DC
  Administered 2021-07-25: 100 ug via ORAL
  Filled 2021-07-25: qty 2

## 2021-07-25 MED ORDER — FOSFOMYCIN TROMETHAMINE 3 G PO PACK
3.0000 g | PACK | Freq: Once | ORAL | Status: AC
  Administered 2021-07-25: 3 g via ORAL
  Filled 2021-07-25: qty 3

## 2021-07-25 MED ORDER — MIRTAZAPINE 15 MG PO TABS: 30.0000 mg | ORAL_TABLET | Freq: Every day | ORAL | Status: DC

## 2021-07-25 MED ORDER — CARBIDOPA-LEVODOPA ER 50-200 MG PO TBCR
2.0000 | EXTENDED_RELEASE_TABLET | Freq: Four times a day (QID) | ORAL | Status: DC
  Administered 2021-07-25 (×2): 2 via ORAL
  Filled 2021-07-25 (×4): qty 2

## 2021-07-25 MED ORDER — IRBESARTAN 150 MG PO TABS
150.0000 mg | ORAL_TABLET | ORAL | Status: AC
  Administered 2021-07-25: 150 mg via ORAL
  Filled 2021-07-25: qty 1
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
## 2021-07-25 NOTE — ED Notes (Signed)
Called for transport to [facility] by [facility] [employer]

## 2021-07-25 NOTE — ED Notes (Signed)
Pts brief changed prior to transport. Pts husband to meet EMS at facility.

## 2021-07-25 NOTE — ED Notes (Signed)
EDP informed of elevated BP and missed BP medications over the last day.

## 2021-07-25 NOTE — Discharge Instructions (Addendum)
The patient's provider may consider a low dose of antipsychotic to help ameliorate her symptoms of paranoia regarding taking her medications.
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
## 2021-07-25 NOTE — ED Provider Notes (Signed)
Vitals:   07/25/21 1030 07/25/21 1100  BP: (!) 188/86 (!) 182/91  Pulse: (!) 50 (!) 54  Resp: 15 16  Temp:    SpO2: 97% 94%     Patient currently asymptomatic, resting comfortably awaiting discharge.  Has been seen by psychiatry, and her son is with her they are both understanding agreeable with plan to discharge back to her facility but her blood pressure is noted to be severely elevated.  She denies any chest pain she has no headache she denies any clinical signs or symptoms that would necessarily correlate with severe hypertension.  She has had some irregularity in her medications which she normally takes due to being here at the ER and also was refusing medicines historically.  Son tells me that time she has been told her blood pressure go as high as 240 at the facility, but knows that they check it regularly and have told him that it runs high but not sure what that actually equates to.  I will give additional dose of her Avapro at this time.  Mild bradycardia so we will withhold additional metoprolol at this point she was dosed that earlier this morning and is not yet due.  Give this additional dose of Avapro and continue to monitor  Vitals:   07/25/21 1311 07/25/21 1330  BP: (!) 168/68 (!) 176/78  Pulse: (!) 56 60  Resp: 13 15  Temp:    SpO2: 98% 99%     Blood pressures improved, they will continue to have this monitored through her primary doctor and also at her care facility who they advised also has a medical team that follows with her now.  Return precautions and treatment recommendations and follow-up discussed with the patient who is agreeable with the plan.  Patient discharging back to her care facility her son will be escorting her there.   Sharyn Creamer, MD 07/25/21 1352

## 2021-07-25 NOTE — ED Notes (Signed)
Pt given meal tray husband at bedside assisting with feeding

## 2021-07-25 NOTE — ED Notes (Signed)
Pt given meal tray, husband at bedside assisting with feeding

## 2021-07-25 NOTE — ED Notes (Signed)
Gave food tray with water. °

## 2021-07-25 NOTE — ED Notes (Signed)
Family at bedside, updated on plan of care.

## 2021-07-25 NOTE — ED Notes (Signed)
EDP informed of continued elevated BP.

## 2021-07-25 NOTE — ED Notes (Signed)
Pt ate orange from breakfast tray

## 2021-07-26 LAB — URINE CULTURE

## 2021-07-27 ENCOUNTER — Emergency Department
Admission: EM | Admit: 2021-07-27 | Discharge: 2021-07-27 | Disposition: A | Discharge status: Home / Self Care | Payer: Medicare PPO | Attending: Emergency Medicine | Admitting: Emergency Medicine

## 2021-07-27 ENCOUNTER — Emergency Department: Payer: Medicare PPO

## 2021-07-27 ENCOUNTER — Other Ambulatory Visit: Payer: Self-pay

## 2021-07-27 DIAGNOSIS — R569 Unspecified convulsions: Secondary | ICD-10-CM | POA: Diagnosis present

## 2021-07-27 DIAGNOSIS — G20 Parkinson's disease: Secondary | ICD-10-CM | POA: Insufficient documentation

## 2021-07-27 LAB — COMPREHENSIVE METABOLIC PANEL
ALT: <5 U/L (ref 0–44)
AST: 17 U/L (ref 15–41)
Albumin: 3.7 g/dL (ref 3.5–5.0)
Alkaline Phosphatase: 81 U/L (ref 38–126)
Anion gap: 13 (ref 5–15)
BUN: 17 mg/dL (ref 8–23)
CO2: 25 mmol/L (ref 22–32)
Calcium: 9 mg/dL (ref 8.9–10.3)
Chloride: 105 mmol/L (ref 98–111)
Creatinine, Ser: 1.08 mg/dL — ABNORMAL HIGH (ref 0.44–1.00)
GFR, Estimated: 56 mL/min — ABNORMAL LOW (ref >60)
Glucose, Bld: 107 mg/dL — ABNORMAL HIGH (ref 70–99)
Potassium: 3 mmol/L — ABNORMAL LOW (ref 3.5–5.1)
Sodium: 143 mmol/L (ref 135–145)
Total Bilirubin: 0.7 mg/dL (ref 0.3–1.2)
Total Protein: 5.8 g/dL — ABNORMAL LOW (ref 6.5–8.1)

## 2021-07-27 LAB — CBC
HCT: 37 % (ref 36.0–46.0)
Hemoglobin: 11.8 g/dL — ABNORMAL LOW (ref 12.0–15.0)
MCH: 32.1 pg (ref 26.0–34.0)
MCHC: 31.9 g/dL (ref 30.0–36.0)
MCV: 100.5 fL — ABNORMAL HIGH (ref 80.0–100.0)
Platelets: 188 10*3/uL (ref 150–400)
RBC: 3.68 MIL/uL — ABNORMAL LOW (ref 3.87–5.11)
RDW: 14 % (ref 11.5–15.5)
WBC: 8.2 10*3/uL (ref 4.0–10.5)
nRBC: 0 % (ref 0.0–0.2)

## 2021-07-27 MED ORDER — LORAZEPAM 0.5 MG PO TABS
0.5000 mg | ORAL_TABLET | Freq: Once | ORAL | Status: AC
  Administered 2021-07-27: 0.5 mg via ORAL
  Filled 2021-07-27: qty 1

## 2021-07-27 NOTE — Discharge Instructions (Signed)
Please follow-up with your neurologist as soon as possible for recheck/reevaluation.  Please return immediately to the emergency department for any further seizure-like activity or any other symptom personally concerning to yourselves.

## 2021-07-27 NOTE — ED Triage Notes (Signed)
Pt from [facility]. Family witnessed pt fall while eating to the floor and witnessed seizure like activity.  Pt was lethargic after same.  More alert on arrival per EMS. 20G RFA.  VSS.  Pt has hx of recent multiple falls.  Seen here recently.

## 2021-07-27 NOTE — ED Provider Notes (Signed)
[facility] Provider Note    Event Date/Time   First MD Initiated Contact with Patient 07/27/21 1943     (approximate)  History   Chief Complaint: Seizures  HPI  Jasmine Mcdonald is a 71 y.o. female with a past medical history of anxiety, Parkinson's, presents from her nursing facility for suspected seizure.  According EMS the staff members at the nursing facility noted that the patient was eating when she seemed to slump down slide out of the chair onto the ground with convulsive movements consistent with seizure.  EMS states upon their arrival patient noted to be quite somnolent and somewhat confused consistent with a postictal state.  Currently patient is awake alert she does have jerking type movements likely due to her Parkinson's medications.  Patient has no complaints.  States she feels back to normal.  No history of seizures previously.  Physical Exam   Triage Vital Signs: ED Triage Vitals  Enc Vitals Group     BP 07/27/21 1951 (!) 158/85     Pulse Rate 07/27/21 1951 84     Resp 07/27/21 1951 16     Temp 07/27/21 1951 98.5 F (36.9 C)     Temp Source 07/27/21 1951 Oral     SpO2 07/27/21 1951 99 %     Weight --      Height --      Head Circumference --      Peak Flow --      Pain Score 07/27/21 1948 0     Pain Loc --      Pain Edu? --      Excl. in GC? --     Most recent vital signs: Vitals:   07/27/21 1951  BP: (!) 158/85  Pulse: 84  Resp: 16  Temp: 98.5 F (36.9 C)  SpO2: 99%    General: Awake, no distress.  CV:  Good peripheral perfusion.  Regular rate and rhythm  Resp:  Normal effort.  Equal breath sounds bilaterally.  Abd:  No distention.  Soft, nontender.  No rebound or guarding.   RADIOLOGY  I have personally reviewed the CT images of the head, no acute abnormality on my evaluation. Radiology has read the CT is stable changes with no acute abnormality.  MEDICATIONS ORDERED IN ED: Medications  LORazepam (ATIVAN)  tablet 0.5 mg (has no administration in time range)     IMPRESSION / MDM / ASSESSMENT AND PLAN / ED COURSE  I reviewed the triage vital signs and the nursing notes.  Patient presents emergency department after seizure-like activity at a nursing facility.  No history of seizures previously.  Given new onset of seizures we will check labs and obtain a CT scan of the head as a precaution.  Overall the patient appears well, states she feels back to normal.  I reviewed the patient's MAR she is on Ativan but a low-dose 0.5 mg twice daily as needed.  In looking at her [facility] it appears that her last dose was 3 days ago on 07/24/2021.  This could possibly be related due to a benzodiazepine withdrawal seizure.  We will dose oral Ativan in the emergency department.  We will check labs and CT scan continue to closely monitor.  Patient agreeable to plan.  Patient's work-up is been overall reassuring.  CT scan of the head is negative.  Lab work has resulted large within normal limits with a normal CBC, overall reassuring chemistry.  Patient continues to appear well in  the emergency department.  Highly suspect patient could be suffering from benzodiazepine withdrawal as it has been 3 days since her last dose of Ativan.  Family is here with the patient.  After speaking jointly with the family and the patient we have decided to hold off on antiepileptic medications.  The daughter is going to make an appointment with her neurologist.  However I did discuss if the patient has another seizure or seizure-like activity they should return to the emergency department and likely would be started on an epileptic such as Keppra at that time.  They are agreeable this plan patient will be discharged home.  FINAL CLINICAL IMPRESSION(S) / ED DIAGNOSES   New seizure    Note:  This document was prepared using Dragon voice recognition software and may include unintentional dictation errors.   Minna Antis, MD 07/27/21  2158

## 2021-07-27 NOTE — ED Notes (Signed)
EMS called for transport.

## 2021-07-27 NOTE — ED Notes (Signed)
Report called to [facility]

## 2021-07-28 ENCOUNTER — Other Ambulatory Visit: Payer: Self-pay

## 2021-07-28 ENCOUNTER — Other Ambulatory Visit: Payer: Self-pay | Admitting: Internal Medicine

## 2021-08-11 ENCOUNTER — Other Ambulatory Visit: Payer: Self-pay | Admitting: Internal Medicine

## 2021-08-18 ENCOUNTER — Other Ambulatory Visit: Payer: Self-pay | Admitting: Internal Medicine

## 2022-03-18 ENCOUNTER — Encounter (HOSPITAL_COMMUNITY): Payer: Self-pay | Admitting: Emergency Medicine

## 2022-03-18 ENCOUNTER — Emergency Department (HOSPITAL_COMMUNITY): Payer: Medicare PPO

## 2022-03-18 ENCOUNTER — Other Ambulatory Visit: Payer: Self-pay

## 2022-03-18 ENCOUNTER — Observation Stay (HOSPITAL_COMMUNITY)
Admission: EM | Admit: 2022-03-18 | Discharge: 2022-03-20 | Disposition: A | Discharge status: Skilled Nursing Facility | Payer: Medicare PPO | Attending: Internal Medicine | Admitting: Internal Medicine

## 2022-03-18 DIAGNOSIS — L8991 Pressure ulcer of unspecified site, stage 1: Secondary | ICD-10-CM | POA: Diagnosis not present

## 2022-03-18 DIAGNOSIS — Z1152 Encounter for screening for COVID-19: Secondary | ICD-10-CM | POA: Diagnosis not present

## 2022-03-18 DIAGNOSIS — I6381 Other cerebral infarction due to occlusion or stenosis of small artery: Secondary | ICD-10-CM | POA: Diagnosis not present

## 2022-03-18 DIAGNOSIS — Z7901 Long term (current) use of anticoagulants: Secondary | ICD-10-CM | POA: Insufficient documentation

## 2022-03-18 DIAGNOSIS — I4891 Unspecified atrial fibrillation: Secondary | ICD-10-CM | POA: Insufficient documentation

## 2022-03-18 DIAGNOSIS — R569 Unspecified convulsions: Secondary | ICD-10-CM | POA: Diagnosis not present

## 2022-03-18 DIAGNOSIS — I2699 Other pulmonary embolism without acute cor pulmonale: Secondary | ICD-10-CM | POA: Diagnosis not present

## 2022-03-18 DIAGNOSIS — R4182 Altered mental status, unspecified: Principal | ICD-10-CM | POA: Insufficient documentation

## 2022-03-18 DIAGNOSIS — I10 Essential (primary) hypertension: Secondary | ICD-10-CM | POA: Insufficient documentation

## 2022-03-18 DIAGNOSIS — G20A1 Parkinson's disease without dyskinesia, without mention of fluctuations: Secondary | ICD-10-CM | POA: Diagnosis present

## 2022-03-18 DIAGNOSIS — F0390 Unspecified dementia without behavioral disturbance: Secondary | ICD-10-CM | POA: Diagnosis not present

## 2022-03-18 DIAGNOSIS — Z79899 Other long term (current) drug therapy: Secondary | ICD-10-CM | POA: Diagnosis not present

## 2022-03-18 DIAGNOSIS — G20C Parkinsonism, unspecified: Secondary | ICD-10-CM | POA: Diagnosis not present

## 2022-03-18 HISTORY — DX: Unspecified atrial fibrillation: I48.91

## 2022-03-18 LAB — COMPREHENSIVE METABOLIC PANEL
ALT: <5 U/L (ref 0–44)
AST: 14 U/L — ABNORMAL LOW (ref 15–41)
Albumin: 3.3 g/dL — ABNORMAL LOW (ref 3.5–5.0)
Alkaline Phosphatase: 68 U/L (ref 38–126)
Anion gap: 4 — ABNORMAL LOW (ref 5–15)
BUN: 31 mg/dL — ABNORMAL HIGH (ref 8–23)
CO2: 28 mmol/L (ref 22–32)
Calcium: 9.5 mg/dL (ref 8.9–10.3)
Chloride: 109 mmol/L (ref 98–111)
Creatinine, Ser: 0.99 mg/dL (ref 0.44–1.00)
GFR, Estimated: >60 mL/min (ref >60)
Glucose, Bld: 99 mg/dL (ref 70–99)
Potassium: 3.9 mmol/L (ref 3.5–5.1)
Sodium: 141 mmol/L (ref 135–145)
Total Bilirubin: 0.5 mg/dL (ref 0.3–1.2)
Total Protein: 5.7 g/dL — ABNORMAL LOW (ref 6.5–8.1)

## 2022-03-18 LAB — CBC
HCT: 32.6 % — ABNORMAL LOW (ref 36.0–46.0)
Hemoglobin: 10.5 g/dL — ABNORMAL LOW (ref 12.0–15.0)
MCH: 33.1 pg (ref 26.0–34.0)
MCHC: 32.2 g/dL (ref 30.0–36.0)
MCV: 102.8 fL — ABNORMAL HIGH (ref 80.0–100.0)
Platelets: 232 10*3/uL (ref 150–400)
RBC: 3.17 MIL/uL — ABNORMAL LOW (ref 3.87–5.11)
RDW: 14.6 % (ref 11.5–15.5)
WBC: 4.5 10*3/uL (ref 4.0–10.5)
nRBC: 0 % (ref 0.0–0.2)

## 2022-03-18 LAB — URINALYSIS, ROUTINE W REFLEX MICROSCOPIC
Bilirubin Urine: NEGATIVE
Glucose, UA: NEGATIVE mg/dL
Hgb urine dipstick: NEGATIVE
Ketones, ur: 5 mg/dL — AB
Leukocytes,Ua: NEGATIVE
Nitrite: NEGATIVE
Protein, ur: 30 mg/dL — AB
Specific Gravity, Urine: 1.024 (ref 1.005–1.030)
pH: 6 (ref 5.0–8.0)

## 2022-03-18 LAB — AMMONIA: Ammonia: <10 umol/L (ref 9–35)

## 2022-03-18 LAB — BLOOD GAS, VENOUS
Acid-Base Excess: 6.3 mmol/L — ABNORMAL HIGH (ref 0.0–2.0)
Bicarbonate: 32.3 mmol/L — ABNORMAL HIGH (ref 20.0–28.0)
Drawn by: 27160
O2 Saturation: 30.7 %
Patient temperature: 36.7
pCO2, Ven: 50 mmHg (ref 44–60)
pH, Ven: 7.41 (ref 7.25–7.43)
pO2, Ven: <31 mmHg — CL (ref 32–45)

## 2022-03-18 LAB — SARS CORONAVIRUS 2 BY RT PCR: SARS Coronavirus 2 by RT PCR: NEGATIVE

## 2022-03-18 LAB — GLUCOSE, CAPILLARY: Glucose-Capillary: 98 mg/dL (ref 70–99)

## 2022-03-18 LAB — PROTIME-INR
INR: 1.3 — ABNORMAL HIGH (ref 0.8–1.2)
Prothrombin Time: 16 seconds — ABNORMAL HIGH (ref 11.4–15.2)

## 2022-03-18 LAB — CBG MONITORING, ED: Glucose-Capillary: 91 mg/dL (ref 70–99)

## 2022-03-18 MED ORDER — APIXABAN 5 MG PO TABS
5.0000 mg | ORAL_TABLET | Freq: Two times a day (BID) | ORAL | Status: DC
  Administered 2022-03-18 – 2022-03-20 (×4): 5 mg via ORAL
  Filled 2022-03-18 (×4): qty 1

## 2022-03-18 MED ORDER — LACOSAMIDE 50 MG PO TABS
100.0000 mg | ORAL_TABLET | Freq: Two times a day (BID) | ORAL | Status: DC
  Administered 2022-03-18 – 2022-03-20 (×4): 100 mg via ORAL
  Filled 2022-03-18 (×4): qty 2

## 2022-03-18 MED ORDER — ACETAMINOPHEN 325 MG PO TABS: 650.0000 mg | ORAL_TABLET | ORAL | Status: DC | PRN

## 2022-03-18 MED ORDER — ACETAMINOPHEN 650 MG RE SUPP: 650.0000 mg | RECTAL | Status: DC | PRN

## 2022-03-18 MED ORDER — HYDRALAZINE HCL 20 MG/ML IJ SOLN: 10.0000 mg | INTRAMUSCULAR | Status: DC | PRN

## 2022-03-18 MED ORDER — SENNOSIDES-DOCUSATE SODIUM 8.6-50 MG PO TABS: 1.0000 | ORAL_TABLET | Freq: Every evening | ORAL | Status: DC | PRN

## 2022-03-18 MED ORDER — MIRTAZAPINE 30 MG PO TABS
45.0000 mg | ORAL_TABLET | Freq: Every day | ORAL | Status: DC
  Administered 2022-03-18 – 2022-03-19 (×2): 45 mg via ORAL
  Filled 2022-03-18 (×3): qty 1

## 2022-03-18 MED ORDER — LORAZEPAM 2 MG/ML IJ SOLN
0.5000 mg | Freq: Once | INTRAMUSCULAR | Status: AC
  Administered 2022-03-19: 0.5 mg via INTRAVENOUS
  Filled 2022-03-18: qty 1

## 2022-03-18 MED ORDER — SERTRALINE HCL 50 MG PO TABS
50.0000 mg | ORAL_TABLET | Freq: Every day | ORAL | Status: DC
  Administered 2022-03-19 – 2022-03-20 (×2): 50 mg via ORAL
  Filled 2022-03-18 (×2): qty 1

## 2022-03-18 MED ORDER — SODIUM CHLORIDE 0.9 % IV SOLN: INTRAVENOUS | Status: AC

## 2022-03-18 MED ORDER — LEVOTHYROXINE SODIUM 100 MCG PO TABS
100.0000 ug | ORAL_TABLET | Freq: Every day | ORAL | Status: DC
  Administered 2022-03-19 – 2022-03-20 (×2): 100 ug via ORAL
  Filled 2022-03-18 (×2): qty 1

## 2022-03-18 MED ORDER — SODIUM CHLORIDE 0.9 % IV BOLUS
1000.0000 mL | Freq: Once | INTRAVENOUS | Status: AC
  Administered 2022-03-18: 1000 mL via INTRAVENOUS

## 2022-03-18 MED ORDER — STROKE: EARLY STAGES OF RECOVERY BOOK: Freq: Once | Status: AC

## 2022-03-18 MED ORDER — LORAZEPAM 0.5 MG PO TABS: 0.5000 mg | ORAL_TABLET | Freq: Two times a day (BID) | ORAL | Status: DC | PRN

## 2022-03-18 MED ORDER — CARBIDOPA-LEVODOPA 25-100 MG PO TABS
1.0000 | ORAL_TABLET | Freq: Three times a day (TID) | ORAL | Status: DC
  Administered 2022-03-18 – 2022-03-20 (×5): 1 via ORAL
  Filled 2022-03-18 (×2): qty 1

## 2022-03-18 MED ORDER — ACETAMINOPHEN 160 MG/5ML PO SOLN: 650.0000 mg | ORAL | Status: DC | PRN

## 2022-03-18 NOTE — ED Notes (Addendum)
Straw used for swallow screen because pt is normally only able to drink with a straw due to limited ROM of the neck. Pt was able to drink all of the water without incident.

## 2022-03-18 NOTE — ED Notes (Signed)
Pt response improved. She opened eyes and attempted verbal response to EDP questions. Some movement noted in attempt to follow commands.

## 2022-03-18 NOTE — H&P (Signed)
History and Physical    Jasmine Mcdonald VVO:[id number] DOB: Aug 26, 1950 DOA: 03/18/2022  PCP: Corky Downs, MD   Patient coming from: [facility], [place]  I have personally briefly reviewed patient's old medical records in [facility] Link  Chief Complaint: AMS, bradycardia  HPI: Jasmine Mcdonald is a 71 y.o. female with medical history significant for Parkinson's disease, bedbound, atrial fibrillation on chronic anticoagulation, anxiety and thyroid disease, hypertension. Patient was brought to the ED from [facility] reports of altered mental status.  At the time of my evaluation, patient is awake, alert, she is able to answer some questions, but the volume and tone of her speech is quite low.  She sometimes whispers. Patient's daughter Alphonzo Lemmings is at bedside.  She reports at baseline, patient is bedbound, she also has contractures to her lower extremities, recently, patient has appeared foggy, just staring into space-she is unable to give a timeframe for this, but she reports today, patient was not talking as per usual.  She has good days and bad days, sometimes she would answer questions, and talk,.  But today patient has barely talked, diffuse what she was able to get from patient were whispered.  Patient is able to tell me her name, she denies pain, tells me she is in the hospital because she is not "stable".   Prolonged hospitalization at [facility] 3/17 to 09/18/2021-for seizure disorder, and dysautonomia with hypertension.  She was started on Vimpat 100 mg twice daily.  Also reported significant dyskinesia on higher doses of Sinemet hence dose was reduced to 1 tablet 3 times daily. Also hospitalization 2/25 to 3/3 at [facility] for drug-induced dyskinesia, had extensive work-up including lumbar puncture.  ED Course: Temperature 98.  Heart rate 49-66, not to 70s on my evaluation.  Respirate rate 15-17.  Blood pressure systolic 105-169.  O2 sats greater than 99% on room  air. UA few bacteria.  Head CT with age-indeterminate right thalamic lacunar infarct. Neurologist Dr. Selina Cooley was consulted, recommended admission for stroke work-up, determine etiology of altered mental status.  Review of Systems: As per HPI all other systems reviewed and negative.  Past Medical History:  Diagnosis Date   Anxiety    Atrial fibrillation (HCC)    Hypertension    Parkinson's disease    Thyroid disease     Past Surgical History:  Procedure Laterality Date   CESAREAN SECTION     CHOLECYSTECTOMY     KNEE SURGERY       reports that she has never smoked. She has never used smokeless tobacco. She reports that she does not drink alcohol and does not use drugs.  Family history of hypertension.  Prior to Admission medications   Medication Sig Start Date End Date Taking? Authorizing Provider  carbidopa-levodopa (SINEMET IR) 25-100 MG tablet Take 1 tablet by mouth 3 (three) times daily. 07/20/21  Yes [provider]  ELIQUIS 5 MG TABS tablet Take 5 mg by mouth 2 (two) times daily. 03/07/22  Yes [provider]  Lacosamide 100 MG TABS Take 100 mg by mouth 2 (two) times daily. 02/17/22  Yes [provider]  levothyroxine (SYNTHROID) 100 MCG tablet TAKE 1 TABLET BY MOUTH EVERY DAY Patient taking differently: Take 100 mcg by mouth daily before breakfast. 08/18/21  Yes Kara Dies, NP  metoprolol tartrate (LOPRESSOR) 50 MG tablet TAKE 1 TABLET BY MOUTH TWICE A DAY Patient taking differently: Take 50 mg by mouth 2 (two) times daily. 08/18/21  Yes Kara Dies,  NP  mirtazapine (REMERON) 45 MG tablet Take 45 mg by mouth at bedtime. 07/03/21  Yes [provider]  Multiple Vitamin (MULTIVITAMIN) capsule Take 1 capsule by mouth daily.   Yes [provider]  ondansetron (ZOFRAN) 4 MG tablet Take 1 tablet (4 mg total) by mouth every 8 (eight) hours as needed. Patient taking differently: Take 4 mg by mouth every 8 (eight) hours as needed for  nausea or vomiting. 07/28/18  Yes Nance Pear, MD  sertraline (ZOLOFT) 50 MG tablet Take 50 mg by mouth daily. 02/18/21  Yes [provider]  LORazepam (ATIVAN) 0.5 MG tablet Take 0.5 mg by mouth 2 (two) times daily as needed for anxiety or sleep.    [provider]    Physical Exam: Vitals:   03/18/22 1117 03/18/22 1300 03/18/22 1530 03/18/22 1538  BP:  (!) 105/57 (!) 169/79   Pulse:  (!) 56 66   Resp:  15 17   Temp: 98 F (36.7 C)   98.5 F (36.9 C)  TempSrc: Oral   Oral  SpO2:  98% 99%     Constitutional: Chronically ill-appearing,, calm, comfortable Vitals:   03/18/22 1117 03/18/22 1300 03/18/22 1530 03/18/22 1538  BP:  (!) 105/57 (!) 169/79   Pulse:  (!) 56 66   Resp:  15 17   Temp: 98 F (36.7 C)   98.5 F (36.9 C)  TempSrc: Oral   Oral  SpO2:  98% 99%    Eyes: PERRL, lids and conjunctivae normal ENMT: Mucous membranes are moist.   Neck: normal, supple, no masses, no thyromegaly Respiratory: clear to auscultation bilaterally, no wheezing, no crackles.  Cardiovascular: Regular rate and rhythm, 3/6 systolic murmur loudest aortic area/ rubs / gallops. No extremity edema.   Abdomen: no tenderness, no masses palpated. No hepatosplenomegaly. Bowel sounds positive.  Musculoskeletal: no clubbing / cyanosis. Flexion contractures to bilateral hips and knees. Skin: no rashes, lesions, ulcers. No induration Neurologic: Limited exam due to baseline poor functional status.  No facial asymmetry, extraocular muscles intact, left upper extremity- good grip strength, poor grip strength right upper extremity.  Speech occasionally audible occasional whispers, no evidence of slurring, low volume and tone. Psychiatric: Awake and alert oriented to person and place.  Labs on Admission: I have personally reviewed following labs and imaging studies  CBC: Recent Labs  Lab 03/18/22 1202  WBC 4.5  HGB 10.5*  HCT 32.6*  MCV 102.8*  PLT 694   Basic Metabolic  Panel: Recent Labs  Lab 03/18/22 1202  NA 141  K 3.9  CL 109  CO2 28  GLUCOSE 99  BUN 31*  CREATININE 0.99  CALCIUM 9.5   GFR: CrCl cannot be calculated (Unknown ideal weight.). Liver Function Tests: Recent Labs  Lab 03/18/22 1202  AST 14*  ALT <5  ALKPHOS 68  BILITOT 0.5  PROT 5.7*  ALBUMIN 3.3*   No results for input(s): "LIPASE", "AMYLASE" in the last 168 hours. Recent Labs  Lab 03/18/22 1212  AMMONIA <10   Coagulation Profile: Recent Labs  Lab 03/18/22 1202  INR 1.3*   CBG: Recent Labs  Lab 03/18/22 1158  GLUCAP 91   Urine analysis:    Component Value Date/Time   COLORURINE YELLOW 03/18/2022 1507   APPEARANCEUR HAZY (A) 03/18/2022 1507   LABSPEC 1.024 03/18/2022 1507   PHURINE 6.0 03/18/2022 1507   GLUCOSEU NEGATIVE 03/18/2022 1507   HGBUR NEGATIVE 03/18/2022 [place] 03/18/2022 1507   KETONESUR 5 (A) 03/18/2022  [place] (A) 03/18/2022 1507   NITRITE NEGATIVE 03/18/2022 1507   LEUKOCYTESUR NEGATIVE 03/18/2022 1507    Radiological Exams on Admission: CT Head Wo Contrast  Result Date: 03/18/2022 CLINICAL DATA:  71 year old female with altered mental status and bradycardia. Unresponsive. EXAM: CT HEAD WITHOUT CONTRAST TECHNIQUE: Contiguous axial images were obtained from the base of the skull through the vertex without intravenous contrast. RADIATION DOSE REDUCTION: This exam was performed according to the departmental dose-optimization program which includes automated exposure control, adjustment of the mA and/or kV according to patient size and/or use of iterative reconstruction technique. COMPARISON:  Head CT 07/27/2021 and earlier. FINDINGS: Brain: Stable cerebral volume. No midline shift, ventriculomegaly, mass effect, evidence of mass lesion, intracranial hemorrhage or evidence of cortically based acute infarction. Confluent bilateral cerebral white matter hypodensity appears stable and slightly greater in the left  hemisphere. New small round hypodensity in the right thalamus since February (series 4, image 57) is age indeterminate. Contralateral left basal ganglia hypodensity appears stable (coronal image 39). Otherwise stable gray-white differentiation. Vascular: Calcified atherosclerosis at the skull base. No suspicious intracranial vascular hyperdensity. Skull: No acute osseous abnormality identified. Sinuses/Orbits: Visualized paranasal sinuses and mastoids are stable and well aerated. Trace bubbly opacity in the right sphenoid sinus now. Other: Visualized orbits and scalp soft tissues are within normal limits. IMPRESSION: 1. Age indeterminate right thalamic lacunar infarct, new since February. 2. Otherwise stable underlying chronic white matter disease. No other acute intracranial abnormality. Electronically Signed   By: Genevie Ann M.D.   On: 03/18/2022 12:29    EKG: Pending  Assessment/Plan Principal Problem:   AMS (altered mental status) Active Problems:   Seizure ([place])  \ Assessment and Plan: * AMS (altered mental status) Reports of reduced verbal response onset today. Reports of patient staring into space, fogginess/confusion- recent, unknown acuity.  Head CT-age-indeterminate right thalamic lacunar infarct.  UA with few bacteria only not convincing for UTI.  No symptoms referable to Infectious Etiology.  Patient has baseline poor functional status. -Trial of hydration normal saline 75 cc/h for 15 hours -As needed hydralazine 10 mg -Neurologist Dr. Quinn Axe consulted-  continue Eliquis, Permissive HTN x48 hrs from sx onset or until stroke ruled out by MRI goal BP <180/105 (relative permissive HTN given patient is on anticoagulation). PRN labetalol or hydralazine if BP above these parameters. Avoid oral antihypertensives. - MRI brain wo contrast - CTA or MRA H&N - TTE  - Check A1c and LDL + add statin per guidelines - No indication for antiplatelets in the setting of therapeutic anticoagulation - q4 hr  neuro checks - STAT head CT for any change in neuro exam - Tele - PT/OT/SLP - Stroke education - Amb referral to neurology upon discharge  - Routine teleconsult to Dr. Hortense Ramal tmrw AM (under neurology in [facility])  Atrial fibrillation ([place]) On chronic anticoagulation with Eliquis and on metoprolol.  Reported bradycardia heart rate up to 40s.  Here heart rate has ranged from 40s to 70s. -Hold metoprolol for now -Resume Eliquis  Seizure ([place]) Resume lacosamide  Parkinson's disease Bedbound.  Daughter reports early dementia also. -Resume Sinemet   DVT prophylaxis: Eliquis Code Status: DNR- confirmed with daughter Loree Fee at bedside.  Patient's brother Thurmond Butts is HCPOA-I have updated him on the phone. Family Communication: Daughter Whitney at bedside. Disposition Plan: ~ 1- 2 dys Consults called: Neurology Admission status:   Obs tele   Author: Bethena Roys, MD 03/18/2022 6:55 PM  For on call review  [email or web address]

## 2022-03-18 NOTE — Plan of Care (Signed)
Neurology plan of care  I was contacted by Dr. Langston Masker regarding this patient presenting with acute on chronic encephalopathy.  She is a 71 year old with a history of Parkinson's disease who has a very poor functional baseline.  She has dementia with waxing and waning mental status, has some days per family where she converses normally and some days where she does not speak much at all.  She was nonverbal earlier with EMS and is minimally verbal on arrival but was able to whisper a few answers to Dr. Jones Broom questions.  Based on the family's description she does not seem significantly far from her baseline fluctuations although she is less verbal than usual.  Head CT was performed which showed a right thalamic infarct that is age-indeterminate but is new since her last scan in February.  On my review this does not look acute but rather subacute to chronic.  She will require admission for altered mental status and can get a stroke work-up at that time.  She is on chronic Eliquis for a PE and given the at least subacute appearance of the infarct on CT I do not feel that the Eliquis needs to be held.  The risk of holding Eliquis is likely greater than the risk of hemorrhagic conversion since the stroke is small and favored not to be acute.  Recommendations: - Admission for AMS, workup per admitting team. Stroke workup per below. - Continue eliquis for PE - Permissive HTN x48 hrs from sx onset or until stroke ruled out by MRI goal BP <180/105 (relative permissive HTN given patient is on anticoagulation). PRN labetalol or hydralazine if BP above these parameters. Avoid oral antihypertensives. - MRI brain wo contrast - CTA or MRA H&N - TTE  - Check A1c and LDL + add statin per guidelines - No indication for antiplatelets in the setting of therapeutic anticoagulation - q4 hr neuro checks - STAT head CT for any change in neuro exam - Tele - PT/OT/SLP - Stroke education - Amb referral to neurology upon  discharge  - Routine teleconsult to Dr. Hortense Ramal tmrw AM (under neurology in [place])  Su Monks, MD [facility] [phone]  If 7pm- 7am, please page neurology on call as listed in [place].

## 2022-03-18 NOTE — ED Notes (Signed)
Family reports that the patient requires assistance with eating and typically eats soft solid foods.

## 2022-03-18 NOTE — Assessment & Plan Note (Signed)
Bedbound.  Daughter reports early dementia also. -Resume Sinemet

## 2022-03-18 NOTE — Assessment & Plan Note (Signed)
On chronic anticoagulation with Eliquis and on metoprolol.  Reported bradycardia heart rate up to 40s.  Here heart rate has ranged from 40s to 70s. -Hold metoprolol for now -Resume Eliquis

## 2022-03-18 NOTE — Assessment & Plan Note (Addendum)
-  Continue lacosamide -EEG not demonstrating epileptic waves or acute abnormalities.

## 2022-03-18 NOTE — Assessment & Plan Note (Addendum)
Reports of reduced verbal response onset today. Reports of patient staring into space, fogginess/confusion- recent, unknown acuity.  Head CT-age-indeterminate right thalamic lacunar infarct.  UA with few bacteria only not convincing for UTI.  No symptoms referable to Infectious Etiology.  Patient has baseline poor functional status. -Trial of hydration normal saline 75 cc/h for 15 hours -As needed hydralazine 10 mg -Neurologist Dr. Quinn Axe consulted-  continue Eliquis, Permissive HTN x48 hrs from sx onset or until stroke ruled out by MRI goal BP <180/105 (relative permissive HTN given patient is on anticoagulation). PRN labetalol or hydralazine if BP above these parameters. Avoid oral antihypertensives. - MRI brain wo contrast - CTA or MRA H&N - TTE  - Check A1c and LDL + add statin per guidelines - No indication for antiplatelets in the setting of therapeutic anticoagulation - q4 hr neuro checks - STAT head CT for any change in neuro exam - Tele - PT/OT/SLP - Stroke education - Amb referral to neurology upon discharge  - Routine teleconsult to Dr. Hortense Ramal tmrw AM (under neurology in [place])

## 2022-03-18 NOTE — ED Notes (Signed)
Date and time results received: 03/18/22 1222 (use smartphrase ".now" to insert current time)  Test: Venous PCO2 Critical Value: <31  Name of Provider Notified: Dr. Langston Masker  Orders Received? Or Actions Taken?: see chart

## 2022-03-18 NOTE — ED Triage Notes (Signed)
Pt BIB [facility] with reports of AMS and bradycardia. Per facility pt is normally able to communicate. At this time pt is not responding to voice.

## 2022-03-18 NOTE — ED Provider Notes (Signed)
[facility] EMERGENCY DEPARTMENT Provider Note   CSN: [id number] Arrival date & time: 03/18/22  1101     History  Chief Complaint  Patient presents with   Altered Mental Status    Jasmine Mcdonald is a 71 y.o. female with history of Parkinson's, who is DNR, presenting with confusion and altered mental status.  She presents for nursing facility.  The patient is minimally verbal on arrival, but able to whisper to me a few answers.  She was reportedly nonverbal earlier today and for EMS.  Her daughter subsequently arrived the bedside provided additional history.  Mother has Parkinson's dementia with waxing and waning mental status, has days where she does not speak much, days where she is conversing normally.  Today is atypical with the amount of silence from her mother.  The patient is on Eliquis for chronic PE.  HPI     Home Medications Prior to Admission medications   Medication Sig Start Date End Date Taking? Authorizing Provider  carbidopa-levodopa (SINEMET IR) 25-100 MG tablet Take 1 tablet by mouth 3 (three) times daily. 07/20/21  Yes [provider]  ELIQUIS 5 MG TABS tablet Take 5 mg by mouth 2 (two) times daily. 03/07/22  Yes [provider]  Lacosamide 100 MG TABS Take 100 mg by mouth 2 (two) times daily. 02/17/22  Yes [provider]  levothyroxine (SYNTHROID) 100 MCG tablet TAKE 1 TABLET BY MOUTH EVERY DAY Patient taking differently: Take 100 mcg by mouth daily before breakfast. 08/18/21  Yes Kara Dies, NP  metoprolol tartrate (LOPRESSOR) 50 MG tablet TAKE 1 TABLET BY MOUTH TWICE A DAY Patient taking differently: Take 50 mg by mouth 2 (two) times daily. 08/18/21  Yes Kara Dies, NP  mirtazapine (REMERON) 45 MG tablet Take 45 mg by mouth at bedtime. 07/03/21  Yes [provider]  Multiple Vitamin (MULTIVITAMIN) capsule Take 1 capsule by mouth daily.   Yes [provider]  ondansetron (ZOFRAN) 4 MG tablet Take 1  tablet (4 mg total) by mouth every 8 (eight) hours as needed. Patient taking differently: Take 4 mg by mouth every 8 (eight) hours as needed for nausea or vomiting. 07/28/18  Yes Phineas Semen, MD  sertraline (ZOLOFT) 50 MG tablet Take 50 mg by mouth daily. 02/18/21  Yes [provider]  LORazepam (ATIVAN) 0.5 MG tablet Take 0.5 mg by mouth 2 (two) times daily as needed for anxiety or sleep.    [provider]      Allergies    Patient has no known allergies.    Review of Systems   Review of Systems  Physical Exam Updated Vital Signs BP (!) 105/57   Pulse (!) 56   Temp 98 F (36.7 C) (Oral)   Resp 15   SpO2 98%  Physical Exam Constitutional:      General: She is not in acute distress.    Comments: Thin, frail habitus, whispering  HENT:     Head: Normocephalic and atraumatic.  Eyes:     Conjunctiva/sclera: Conjunctivae normal.     Pupils: Pupils are equal, round, and reactive to light.  Cardiovascular:     Rate and Rhythm: Normal rate and regular rhythm.  Pulmonary:     Effort: Pulmonary effort is normal. No respiratory distress.  Abdominal:     General: There is no distension.     Tenderness: There is no abdominal tenderness.  Skin:    General: Skin is warm and dry.  Neurological:  General: No focal deficit present.     Mental Status: She is alert. Mental status is at baseline.     ED Results / Procedures / Treatments   Labs (all labs ordered are listed, but only abnormal results are displayed) Labs Reviewed  BLOOD GAS, VENOUS - Abnormal; Notable for the following components:      Result Value   pO2, Ven <31 (*)    Bicarbonate 32.3 (*)    Acid-Base Excess 6.3 (*)    All other components within normal limits  COMPREHENSIVE METABOLIC PANEL - Abnormal; Notable for the following components:   BUN 31 (*)    Total Protein 5.7 (*)    Albumin 3.3 (*)    AST 14 (*)    Anion gap 4 (*)    All other components within normal limits  CBC -  Abnormal; Notable for the following components:   RBC 3.17 (*)    Hemoglobin 10.5 (*)    HCT 32.6 (*)    MCV 102.8 (*)    All other components within normal limits  PROTIME-INR - Abnormal; Notable for the following components:   Prothrombin Time 16.0 (*)    INR 1.3 (*)    All other components within normal limits  SARS CORONAVIRUS 2 BY RT PCR  AMMONIA  URINALYSIS, ROUTINE W REFLEX MICROSCOPIC  CBG MONITORING, ED    EKG None  Radiology CT Head Wo Contrast  Result Date: 03/18/2022 CLINICAL DATA:  71 year old female with altered mental status and bradycardia. Unresponsive. EXAM: CT HEAD WITHOUT CONTRAST TECHNIQUE: Contiguous axial images were obtained from the base of the skull through the vertex without intravenous contrast. RADIATION DOSE REDUCTION: This exam was performed according to the departmental dose-optimization program which includes automated exposure control, adjustment of the mA and/or kV according to patient size and/or use of iterative reconstruction technique. COMPARISON:  Head CT 07/27/2021 and earlier. FINDINGS: Brain: Stable cerebral volume. No midline shift, ventriculomegaly, mass effect, evidence of mass lesion, intracranial hemorrhage or evidence of cortically based acute infarction. Confluent bilateral cerebral white matter hypodensity appears stable and slightly greater in the left hemisphere. New small round hypodensity in the right thalamus since February (series 4, image 98) is age indeterminate. Contralateral left basal ganglia hypodensity appears stable (coronal image 39). Otherwise stable gray-white differentiation. Vascular: Calcified atherosclerosis at the skull base. No suspicious intracranial vascular hyperdensity. Skull: No acute osseous abnormality identified. Sinuses/Orbits: Visualized paranasal sinuses and mastoids are stable and well aerated. Trace bubbly opacity in the right sphenoid sinus now. Other: Visualized orbits and scalp soft tissues are within  normal limits. IMPRESSION: 1. Age indeterminate right thalamic lacunar infarct, new since February. 2. Otherwise stable underlying chronic white matter disease. No other acute intracranial abnormality. Electronically Signed   By: Odessa Fleming M.D.   On: 03/18/2022 12:29    Procedures Procedures    Medications Ordered in ED Medications  sodium chloride 0.9 % bolus 1,000 mL (has no administration in time range)    ED Course/ Medical Decision Making/ A&P Clinical Course as of 03/18/22 1522  Sun Mar 18, 2022  1410 Patient's daughter is now present at bedside.  She confirms that the patient mental status is waxing and waning on a daily basis.  There are days where she does not want to talk at all.  She has not been walking and has been bedbound for a long time.  We discussed the CT findings of the age-indeterminate thalamic infarct.  This may be an incidental finding, is  difficult to tell, as the patient is not cooperative with the neurological exam.  The patient is able to whisper appropriately yes or no to simple questions but cannot follow with a further conversation. [MT]  3154 I spoke to Dr Quinn Axe from neurology, who advises medical admission, okay for admission and any pain, MRI tomorrow, and continue to work-up for alternative causes of confusion or encephalopathy.  No antiplatelet medications recommended at this time. Can continue eliquis [MT]    Clinical Course User Index [MT] Roshan Salamon, Carola Rhine, MD                           Medical Decision Making Amount and/or Complexity of Data Reviewed Labs: ordered. Radiology: ordered. ECG/medicine tests: ordered.  Risk Decision regarding hospitalization.   This patient presents to the ED with concern for altered mental status, potential bradycardia. This involves an extensive number of treatment options, and is a complaint that carries with it a high risk of complications and morbidity.  The differential diagnosis includes polypharmacy versus CVA  versus infection versus UTI versus other  Additional history obtained from EMS, patient's daughter  I ordered and personally interpreted labs.  The pertinent results include: No acute emergent findings  I ordered imaging studies including CT head I independently visualized and interpreted imaging which showed suspected subacute thalamic infarct, new since February. I agree with the radiologist interpretation  The patient was maintained on a cardiac monitor.  I personally viewed and interpreted the cardiac monitored which showed an underlying rhythm of: Sinus rhythm and sinus bradycardia  Per my interpretation the patient's ECG shows sinus rhythm with sinus bradycardia, no acute ischemic findings. Prior EKG show resting heart rate approximately 60 bpm.  I do not believe there is a significant drop in her heart rate or that she is confused or altered from a heart rate of 50 bpm.  She does not show signs of high grade heart block  I ordered medication including IV fluids for hydration. Patient bladder scan was empty, made a very small amount of urine.  We will try to send for urinalysis and hydrate her in the meantime  I have reviewed the patients home medicines and have made adjustments as needed  Test Considered: Low suspicion for acute recurring PE as she has been compliant with Eliquis  I consulted with a neurologist on the phone.  Please see separate note.  Recommendations and ED course After the interventions noted above, I reevaluated the patient and found that they have: stayed the same   Dispostion:  After consideration of the diagnostic results and the patients response to treatment, I feel that the patent would benefit from medical admission.         Final Clinical Impression(s) / ED Diagnoses Final diagnoses:  Altered mental status, unspecified altered mental status type  Thalamic infarction [facility])    Rx / DC Orders ED Discharge Orders     None          Shondale Quinley, Carola Rhine, MD 03/18/22 1523

## 2022-03-19 ENCOUNTER — Observation Stay (HOSPITAL_COMMUNITY)
Admit: 2022-03-19 | Discharge: 2022-03-19 | Disposition: A | Discharge status: Home / Self Care | Payer: Medicare PPO | Attending: Internal Medicine | Admitting: Internal Medicine

## 2022-03-19 ENCOUNTER — Observation Stay (HOSPITAL_COMMUNITY): Payer: Medicare PPO

## 2022-03-19 ENCOUNTER — Other Ambulatory Visit (HOSPITAL_COMMUNITY): Payer: Self-pay | Admitting: *Deleted

## 2022-03-19 ENCOUNTER — Observation Stay (HOSPITAL_BASED_OUTPATIENT_CLINIC_OR_DEPARTMENT_OTHER): Payer: Medicare PPO

## 2022-03-19 DIAGNOSIS — R569 Unspecified convulsions: Secondary | ICD-10-CM | POA: Diagnosis not present

## 2022-03-19 DIAGNOSIS — I6381 Other cerebral infarction due to occlusion or stenosis of small artery: Secondary | ICD-10-CM | POA: Diagnosis not present

## 2022-03-19 DIAGNOSIS — I4891 Unspecified atrial fibrillation: Secondary | ICD-10-CM | POA: Diagnosis not present

## 2022-03-19 DIAGNOSIS — G20A1 Parkinson's disease without dyskinesia, without mention of fluctuations: Secondary | ICD-10-CM | POA: Diagnosis not present

## 2022-03-19 DIAGNOSIS — R4182 Altered mental status, unspecified: Secondary | ICD-10-CM | POA: Diagnosis not present

## 2022-03-19 LAB — HIV ANTIBODY (ROUTINE TESTING W REFLEX): HIV Screen 4th Generation wRfx: NONREACTIVE

## 2022-03-19 LAB — ECHOCARDIOGRAM COMPLETE
Area-P 1/2: 1.36 cm2
Height: 66 in
S' Lateral: 1.9 cm
Weight: 1710.77 oz

## 2022-03-19 LAB — LIPID PANEL
Cholesterol: 154 mg/dL (ref 0–200)
HDL: 44 mg/dL (ref >40)
LDL Cholesterol: 92 mg/dL (ref 0–99)
Total CHOL/HDL Ratio: 3.5 RATIO
Triglycerides: 91 mg/dL (ref <150)
VLDL: 18 mg/dL (ref 0–40)

## 2022-03-19 LAB — HEMOGLOBIN A1C
Hgb A1c MFr Bld: 4.7 % — ABNORMAL LOW (ref 4.8–5.6)
Mean Plasma Glucose: 88.19 mg/dL

## 2022-03-19 LAB — TSH: TSH: 3.3 u[IU]/mL (ref 0.350–4.500)

## 2022-03-19 LAB — FOLATE: Folate: 12.1 ng/mL (ref >5.9)

## 2022-03-19 LAB — VITAMIN B12: Vitamin B-12: 310 pg/mL (ref 180–914)

## 2022-03-19 MED ORDER — GADOBUTROL 1 MMOL/ML IV SOLN
5.0000 mL | Freq: Once | INTRAVENOUS | Status: AC | PRN
  Administered 2022-03-19: 5 mL via INTRAVENOUS

## 2022-03-19 NOTE — Progress Notes (Signed)
EEG complete - results pending 

## 2022-03-19 NOTE — Progress Notes (Signed)
*  PRELIMINARY RESULTS* Echocardiogram 2D Echocardiogram has been performed.  Jasmine Mcdonald 03/19/2022, 2:43 PM

## 2022-03-19 NOTE — Progress Notes (Signed)
SLP Cancellation Note  Patient Details Name: Jasmine Mcdonald MRN: [id number] DOB: 12-14-1950   Cancelled treatment:       Reason Eval/Treat Not Completed: Patient at procedure or test/unavailable (Pt getting EEG. SLP order is for SLE. We do not have an order for BSE. Pt passed the [facility] swallow screen, however is still NPO. RN and MD notified via secure chat. SLP will check back tomorrow for SLE needs.)  Thank you,  Genene Churn, [place]  [place] 03/19/2022, 4:25 PM

## 2022-03-19 NOTE — Progress Notes (Signed)
Progress Note   Patient: Jasmine Mcdonald [id number] DOB: 21-Aug-1950 DOA: 03/18/2022     0 DOS: the patient was seen and examined on 03/19/2022   Brief hospital course: As per H&P written by Dr. Denton Brick on 03/18/2022 Jasmine Mcdonald is a 71 y.o. female with medical history significant for Parkinson's disease, bedbound, atrial fibrillation on chronic anticoagulation, anxiety and thyroid disease, hypertension. Patient was brought to the ED from [facility] reports of altered mental status.  At the time of my evaluation, patient is awake, alert, she is able to answer some questions, but the volume and tone of her speech is quite low.  She sometimes whispers. Patient's daughter Jasmine Mcdonald is at bedside.  She reports at baseline, patient is bedbound, she also has contractures to her lower extremities, recently, patient has appeared foggy, just staring into space-she is unable to give a timeframe for this, but she reports today, patient was not talking as per usual.  She has good days and bad days, sometimes she would answer questions, and talk,.  But today patient has barely talked, diffuse what she was able to get from patient were whispered.  Patient is able to tell me her name, she denies pain, tells me she is in the hospital because she is not "stable".    Prolonged hospitalization at [facility] 3/17 to 09/18/2021-for seizure disorder, and dysautonomia with hypertension.  She was started on Vimpat 100 mg twice daily.  Also reported significant dyskinesia on higher doses of Sinemet hence dose was reduced to 1 tablet 3 times daily. Also hospitalization 2/25 to 3/3 at [facility] for drug-induced dyskinesia, had extensive work-up including lumbar puncture.  Assessment and Plan: * AMS (altered mental status) -Reports of reduced verbal response prior to admission.  -Family also reporting patient stated into space and being foggy/slow to respond -Prior history of seizure; no convulsive episodes appreciated.    -So far work-up unremarkable and unrevealing of acute on normalities -Follow EEG and neurology recommendations. -Continue current anticoagulation therapy.  Atrial fibrillation (HCC) -On chronic anticoagulation with Eliquis and on metoprolol.  -Stable heart rate -Continue telemetry monitoring -Continue the use of Eliquis and follow on telemetry -In order to allow for permissive hypertension while fully ruling out ischemic stroke we will hold on metoprolol.  Seizure ([place]) -Continue lacosamide -Follow EEG results.  Parkinson's disease -Bedbound at baseline.   -Continue low-dose Sinemet as previously prescribed.   Subjective:  Afebrile, no chest pain, no nausea, no vomiting; very sleepy at time of examination due to Ativan prescribed for MRI study.  Physical Exam: Vitals:   03/19/22 0408 03/19/22 0747 03/19/22 1210 03/19/22 1555  BP: (!) 177/86 (!) 179/80 (!) 166/96 (!) 183/89  Pulse: 67 70 66 88  Resp: 18 17 18 17   Temp: (!) 97.4 F (36.3 C) 97.9 F (36.6 C) 97.9 F (36.6 C) 98.7 F (37.1 C)  TempSrc: Oral  Oral   SpO2: 100% 100% 100% 100%  Weight:      Height:       General exam: Sleeping profoundly after receiving Ativan for MRI evaluation; no fever, no nausea or vomiting reported. Respiratory system: Clear to auscultation. Respiratory effort normal. Cardiovascular system:RRR. No rubs or gallops; no JVD. Gastrointestinal system: Abdomen is nondistended, soft and nontender. No organomegaly or masses felt. Normal bowel sounds heard. Central nervous system: Limited examination as she is currently sleeping; no new deficits seen and examined. Extremities: No cyanosis or clubbing; positive chronic atrophic and contracted changes on her extremities appreciated on  exam. Skin: No petechiae.  Stage I decubitus ulcer present at time of admission without signs of superimposed infection. Psychiatry: Judgement and insight appear impaired most likely in the setting of underlying  dementia.  Data Reviewed: MRI of the brain: No acute intracranial normalities and no discrete right thalamic lesion previously seen on CT scan at time of admission. Chronic small vessel disease in relation to moderately advanced signal changes in the cerebral white matter and pons, with a subtle area of chronic cortical encephalomalacia in the right parietal lobe. Lipid panel: Total cholesterol 154, triglycerides 91, HDL 44, LDL 92 TSH: 3.3 X32 355 Folic acid 73.2 2D echo: Preserved ejection fraction, no wall motion abnormalities, grade 1 diastolic dysfunction and no significant valvular disease.  No acute thrombi appreciated. EEG: Pending  Family Communication: Husband at bedside.  Disposition: Status is: Observation The patient remains OBS appropriate and will d/c before 2 midnights.   Planned Discharge Destination: Skilled nursing facility long-term resident.   Author: Barton Dubois, MD 03/19/2022 5:02 PM  For on call review [email or web address].

## 2022-03-19 NOTE — Procedures (Signed)
Patient Name: Jasmine Mcdonald  MRN: [id number]  Epilepsy Attending: Lora Havens  Referring Physician/Provider:  Date: 03/19/2022 Duration: 22.24 mins  Patient history: 71 year old female with multiple medical comorbidities as noted above who presented with worsening mental status and possibly staring off. EEG to evaluate for seizure  Level of alertness: Awake, asleep  AEDs during EEG study: LCM  Technical aspects: This EEG study was done with scalp electrodes positioned according to the 10-20 International system of electrode placement. Electrical activity was reviewed with band pass filter of 1-70Hz , sensitivity of 7 uV/mm, display speed of 79mm/sec with a 60Hz  notched filter applied as appropriate. EEG data were recorded continuously and digitally stored.  Video monitoring was available and reviewed as appropriate.  Description: The posterior dominant rhythm consists of 8 Hz activity of moderate voltage (25-35 uV) seen predominantly in posterior head regions, symmetric and reactive to eye opening and eye closing. Sleep was characterized by vertex waves, sleep spindles (12 to 14 Hz), maximal frontocentral region. Hyperventilation and photic stimulation were not performed.     IMPRESSION: This study is within normal limits. No seizures or epileptiform discharges were seen throughout the recording.  A normal interictal EEG does not exclude the diagnosis of epilepsy.   Henrine Hayter Barbra Sarks

## 2022-03-19 NOTE — Plan of Care (Signed)
Pt improved this am alert and oriented x 2 this am. NO prns given. BP elevated but not meet parameters.  Problem: Education: Goal: Knowledge of General Education information will improve Description: Including pain rating scale, medication(s)/side effects and non-pharmacologic comfort measures Outcome: Progressing   Problem: Health Behavior/Discharge Planning: Goal: Ability to manage health-related needs will improve Outcome: Progressing   Problem: Clinical Measurements: Goal: Ability to maintain clinical measurements within normal limits will improve Outcome: Progressing Goal: Will remain free from infection Outcome: Progressing Goal: Diagnostic test results will improve Outcome: Progressing Goal: Respiratory complications will improve Outcome: Progressing Goal: Cardiovascular complication will be avoided Outcome: Progressing   Problem: Activity: Goal: Risk for activity intolerance will decrease Outcome: Progressing   Problem: Nutrition: Goal: Adequate nutrition will be maintained Outcome: Progressing   Problem: Coping: Goal: Level of anxiety will decrease Outcome: Progressing   Problem: Elimination: Goal: Will not experience complications related to bowel motility Outcome: Progressing Goal: Will not experience complications related to urinary retention Outcome: Progressing   Problem: Pain Managment: Goal: General experience of comfort will improve Outcome: Progressing   Problem: Safety: Goal: Ability to remain free from injury will improve Outcome: Progressing   Problem: Skin Integrity: Goal: Risk for impaired skin integrity will decrease Outcome: Progressing

## 2022-03-19 NOTE — Consult Note (Signed)
I connected with  [place] on 03/19/22 by a video enabled telemedicine application and verified that I am speaking with the correct person using two identifiers with RN.  Patient was very drowsy and unable to provide consent for tele evaluation.    Location of patient: [facility] Location of physician: [facility]  Neurology Consultation Reason for Consult: Altered mental status Referring Physician: Dr Shon Hale  CC: Altered mental status  History is obtained from: Chart review as patient is drowsy  HPI: Jasmine Mcdonald is a 71 y.o. female with history of Parkinson's disease, intellectual disability, atrial fibrillation on Eliquis, hypertension and thyroid disease who was brought in with altered mental status.  Per review of notes, patient has fluctuating mental status at baseline and some days is able to communicate better than others.  Family brought her in because patient appeared to be foggy, staring into space not talking as usual.  Denies any new medications, fever.  Of note, patient has had prolonged hospitalizations at [facility] in February and March where she was diagnosed with seizure and started on Vimpat 100 mg twice daily, dysautonomia with hypertension as well as dyskinesias for which Sinemet was reduced to 1 tablet 25-100 3 times daily.  Per review of her most recent telemetry neuro note from 12/10/2021, patient was also supposed to be on Seroquel but I do not see that on the inpatient medications.  Long-term EEG in February 2023 at [facility] recorded multiple episodes of head face and body myoclonus without EEG correlate.  Patient also reported subjectively feeling as if the seizure is about to occur without EEG changes.  No ictal-interictal activity was noted.   Prolonged EEG in March at [facility] also showed generalized slowing without definite electrographic seizures or epileptiform changes.  ROS:  Unable to obtain due to altered mental status.   Past  Medical History:  Diagnosis Date   Anxiety    Atrial fibrillation (HCC)    Hypertension    Parkinson's disease    Thyroid disease    Social History:  reports that she has never smoked. She has never used smokeless tobacco. She reports that she does not drink alcohol and does not use drugs.   Medications Prior to Admission  Medication Sig Dispense Refill Last Dose   carbidopa-levodopa (SINEMET IR) 25-100 MG tablet Take 1 tablet by mouth 3 (three) times daily.   03/18/2022 at 0830   ELIQUIS 5 MG TABS tablet Take 5 mg by mouth 2 (two) times daily.   03/18/2022 at 0830   Lacosamide 100 MG TABS Take 100 mg by mouth 2 (two) times daily.   03/18/2022 at 0830   levothyroxine (SYNTHROID) 100 MCG tablet TAKE 1 TABLET BY MOUTH EVERY DAY (Patient taking differently: Take 100 mcg by mouth daily before breakfast.) 90 tablet 4 03/18/2022   metoprolol tartrate (LOPRESSOR) 50 MG tablet TAKE 1 TABLET BY MOUTH TWICE A DAY (Patient taking differently: Take 50 mg by mouth 2 (two) times daily.) 180 tablet 4 03/18/2022 at 0830   mirtazapine (REMERON) 45 MG tablet Take 45 mg by mouth at bedtime.   03/17/2022   Multiple Vitamin (MULTIVITAMIN) capsule Take 1 capsule by mouth daily.   03/18/2022   ondansetron (ZOFRAN) 4 MG tablet Take 1 tablet (4 mg total) by mouth every 8 (eight) hours as needed. (Patient taking differently: Take 4 mg by mouth every 8 (eight) hours as needed for nausea or vomiting.) 20 tablet 0 unk   sertraline (ZOLOFT)  50 MG tablet Take 50 mg by mouth daily.   03/18/2022 at 0830   LORazepam (ATIVAN) 0.5 MG tablet Take 0.5 mg by mouth 2 (two) times daily as needed for anxiety or sleep.         Exam: Current vital signs: BP (!) 179/80 (BP Location: Right Arm)   Pulse 70   Temp 97.9 F (36.6 C)   Resp 17   Ht 5\' 6"  (1.676 m)   Wt 48.5 kg   SpO2 100%   BMI 17.26 kg/m  Vital signs in last 24 hours: Temp:  [97.4 F (36.3 C)-98.8 F (37.1 C)] 97.9 F (36.6 C) (10/16 0747) Pulse Rate:   [49-76] 70 (10/16 0747) Resp:  [14-36] 17 (10/16 0747) BP: (105-179)/(57-86) 179/80 (10/16 0747) SpO2:  [98 %-100 %] 100 % (10/16 0747) Weight:  [48.5 kg] 48.5 kg (10/15 2001)   Physical Exam  Constitutional: Appears well-developed and well-nourished.  Neuro: Patient recently received Ativan and went for MRI therefore very drowsy and unable to participate in exam.  However per RN, earlier today patient was awake, alert, oriented to place and person but not to time, following simple commands, moving all 4 extremities symmetrically  I have reviewed labs in epic and the results pertinent to this consultation are: CBC:  Recent Labs  Lab 03/18/22 1202  WBC 4.5  HGB 10.5*  HCT 32.6*  MCV 102.8*  PLT 232    Basic Metabolic Panel:  Lab Results  Component Value Date   NA 141 03/18/2022   K 3.9 03/18/2022   CO2 28 03/18/2022   GLUCOSE 99 03/18/2022   BUN 31 (H) 03/18/2022   CREATININE 0.99 03/18/2022   CALCIUM 9.5 03/18/2022   GFRNONAA >60 03/18/2022   GFRAA >60 07/28/2018   Lipid Panel:  Lab Results  Component Value Date   LDLCALC 92 03/19/2022   HgbA1c:  Lab Results  Component Value Date   HGBA1C 4.7 (L) 03/18/2022   Urine Drug Screen: No results found for: "LABOPIA", "COCAINSCRNUR", "LABBENZ", "AMPHETMU", "THCU", "LABBARB"  Alcohol Level No results found for: "ETH"   I have reviewed the images obtained: MRI brain without contrast and MRA head without contrast 03/19/2022: 1. No acute intracranial abnormality, and no discrete right thalamic lesion - with artifact now suspected on the CT there yesterday. But chronic small vessel disease is suspected in relation to moderately advanced signal changes in the cerebral white matter and pons, with a subtle area of chronic cortical encephalomalacia in the right parietal lobe. Negative intracranial MRA.   MRA neck with and without contrast 03/19/2022: Negative Neck MRA aside from generalized arterial tortuosity.       ASSESSMENT/PLAN: 71 year old female with multiple medical comorbidities as noted above who presented with worsening mental status and possibly staring off.   Transient alteration of awareness -Patient has poor neurological baseline.  Her previous EEG in February did not show definite ictal-interictal activity.  Therefore in the absence of definite clinical seizure-like activity, I am hesitant to change medications -Differential for episode include cardiogenic etiology (does have A-fib) versus behavioral versus less likely seizure  Recommendations: -will assess for toxic-metabolic causes including TSH, B12, folate -will obtain EEG to look for any potential epileptogenicity - For now continue Vimpat 100 mg twice daily -Okay to continue Eliquis -Per review of most recent neurology notes from July 2023, patient was supposed to be on Seroquel 50 mg at bedtime.  Will defer to medicine to review all medications and resume as appropriate -If EEG and  toxic metabolic work-up is within normal limits, no further testing required from neurology standpoint -Discussed plan with Dr. Dyann Kief via secure chat and RN at bedside   Thank you for allowing [place] to participate in the care of this patient. If you have any further questions, please contact  me or neurohospitalist.   Zeb Comfort Epile[facility] neurohospitalist

## 2022-03-19 NOTE — Evaluation (Signed)
Physical Therapy Evaluation Patient Details Name: Jasmine Mcdonald MRN: [id number] DOB: Mar 27, 1951 Today's Date: 03/19/2022  History of Present Illness  Jasmine Mcdonald is a 71 y.o. female with medical history significant for Parkinson's disease, bedbound, atrial fibrillation on chronic anticoagulation, anxiety and thyroid disease, hypertension.  Patient was brought to the ED from [facility] reports of altered mental status.  At the time of my evaluation, patient is awake, alert, she is able to answer some questions, but the volume and tone of her speech is quite low.  She sometimes whispers.  Patient's daughter Jasmine Mcdonald is at bedside.  She reports at baseline, patient is bedbound, she also has contractures to her lower extremities, recently, patient has appeared foggy, just staring into space-she is unable to give a timeframe for this, but she reports today, patient was not talking as per usual.  She has good days and bad days, sometimes she would answer questions, and talk,.  But today patient has barely talked, diffuse what she was able to get from patient were whispered.  Patient is able to tell me her name, she denies pain, tells me she is in the hospital because she is not "stable".   Clinical Impression  Patient presents with severe contractures to BLE and LUE and requires total assist for rolling and bed bound at SNF-LTC per chart.  Plan:  Patient discharged from physical therapy to care of nursing for out of bed using  mechanical lift as tolerated for length of stay.      Recommendations for follow up therapy are one component of a multi-disciplinary discharge planning process, led by the attending physician.  Recommendations may be updated based on patient status, additional functional criteria and insurance authorization.  Follow Up Recommendations No PT follow up      Assistance Recommended at Discharge Intermittent Supervision/Assistance  Patient can return home with the following   A lot of help with bathing/dressing/bathroom;A lot of help with walking and/or transfers    Equipment Recommendations None recommended by PT  Recommendations for Other Services       Functional Status Assessment Patient has not had a recent decline in their functional status     Precautions / Restrictions Precautions Precautions: Fall Restrictions Weight Bearing Restrictions: No      Mobility  Bed Mobility Overal bed mobility: Needs Assistance Bed Mobility: Rolling Rolling: Total assist         General bed mobility comments: Patient presents with severe contractures to BLE and LUE and requires total assist for moving    Transfers                        Ambulation/Gait                  Stairs            Wheelchair Mobility    Modified Rankin (Stroke Patients Only)       Balance                                             Pertinent Vitals/Pain Pain Assessment Pain Assessment: No/denies pain    Home Living Family/patient expects to be discharged to:: Skilled nursing facility                        Prior Function Prior  Level of Function : Needs assist       Physical Assist : Mobility (physical);ADLs (physical) Mobility (physical): Bed mobility;Transfers;Gait;Stairs   Mobility Comments: non-ambulatory, bed bound at SNF LTC ADLs Comments: assisted by SNF LTC staff     Hand Dominance        Extremity/Trunk Assessment   Upper Extremity Assessment Upper Extremity Assessment: RUE deficits/detail;Difficult to assess due to impaired cognition;LUE deficits/detail RUE Deficits / Details: grossly 2/5 LUE Deficits / Details: grossly 1/5, severe contractures  LUE LUE: Unable to fully assess due to immobilization    Lower Extremity Assessment Lower Extremity Assessment: Generalized weakness;RLE deficits/detail;LLE deficits/detail RLE Deficits / Details: severe contractures RLE: Unable to fully assess  due to immobilization RLE Coordination: decreased fine motor;decreased gross motor LLE Deficits / Details: severe contractures LLE: Unable to fully assess due to immobilization LLE Coordination: decreased fine motor;decreased gross motor       Communication   Communication: Other (comment) (mostly non-verbal)  Cognition Arousal/Alertness: Lethargic Behavior During Therapy: Flat affect Overall Cognitive Status: No family/caregiver present to determine baseline cognitive functioning                                          General Comments      Exercises     Assessment/Plan    PT Assessment Patient does not need any further PT services  PT Problem List         PT Treatment Interventions      PT Goals (Current goals can be found in the Care Plan section)  Acute Rehab PT Goals Patient Stated Goal: not stated PT Goal Formulation: With patient Time For Goal Achievement: 03/19/22 Potential to Achieve Goals: Poor    Frequency       Co-evaluation               AM-PAC PT "6 Clicks" Mobility  Outcome Measure Help needed turning from your back to your side while in a flat bed without using bedrails?: Total Help needed moving from lying on your back to sitting on the side of a flat bed without using bedrails?: Total Help needed moving to and from a bed to a chair (including a wheelchair)?: Total Help needed standing up from a chair using your arms (e.g., wheelchair or bedside chair)?: Total Help needed to walk in hospital room?: Total Help needed climbing 3-5 steps with a railing? : Total 6 Click Score: 6    End of Session   Activity Tolerance: Patient limited by lethargy;Patient limited by fatigue Patient left: in bed;with call bell/phone within reach Nurse Communication: Mobility status PT Visit Diagnosis: Muscle weakness (generalized) (M62.81);Other abnormalities of gait and mobility (R26.89);Difficulty in walking, not elsewhere classified  (R26.2)    Time: 1448-1856 PT Time Calculation (min) (ACUTE ONLY): 15 min   Charges:   PT Evaluation $PT Eval Low Complexity: 1 Low PT Treatments $Therapeutic Activity: 8-22 mins        3:50 PM, 03/19/22 Lonell Grandchild, MPT Physical Therapist with [facility] 336 [phone] office [phone] mobile phone

## 2022-03-19 NOTE — Care Management Obs Status (Signed)
[place] NOTIFICATION   Patient Details  Name: Jasmine Mcdonald MRN: [id number] Date of Birth: 05/14/1951   Medicare Observation Status Notification Given:  Yes (copy left in room on bedside table)    Tommy Medal 03/19/2022, 4:59 PM

## 2022-03-19 NOTE — TOC Initial Note (Signed)
Transition of Care [facility]) - Initial/Assessment Note    Patient Details  Name: Jasmine Mcdonald MRN: [id number] Date of Birth: 08-Aug-1950  [facility]) CM/SW Contact:    Boneta Lucks, RN Phone Number: 03/19/2022, 4:02 PM  Clinical Narrative:     Patient admitted with altered mental status. Cm spoke with her son. Patient has been LTC at [facility] since April.  They have tried to do PT/OT, patient will not participate. Patient is bed bound and has all equipment needed.           Expected Discharge Plan: Long Term Acute Care (LTAC) Barriers to Discharge: Continued Medical Work up   Patient Goals and CMS Choice Patient states their goals for this hospitalization and ongoing recovery are:: to return to LTC CMS Medicare.gov Compare Post Acute Care list provided to:: Patient Represenative (must comment) Choice offered to / list presented to : Adult Children  Expected Discharge Plan and Services Expected Discharge Plan: [place] (LTAC)     Post Acute Care Choice: Long Term Acute Care (LTAC)          Prior Living Arrangements/Services   Lives with:: Facility Resident     Admission diagnosis:  Thalamic infarction [facility]) [I63.81] Altered mental status, unspecified altered mental status type [R41.82] AMS (altered mental status) [R41.82] Patient Active Problem List   Diagnosis Date Noted   AMS (altered mental status) 03/18/2022   Seizure ([place]) 03/18/2022   Atrial fibrillation ([place]) 03/18/2022   Acute paranoid reaction ([place]) 07/25/2021   Parkinson's disease 06/22/2021   PCP:  Cletis Athens, MD Pharmacy:   [id number] - [place], [place] - [address] [place] [address] Phone: [phone] Fax: [phone]   Readmission Risk Interventions     No data to display

## 2022-03-20 DIAGNOSIS — I4891 Unspecified atrial fibrillation: Secondary | ICD-10-CM | POA: Diagnosis not present

## 2022-03-20 DIAGNOSIS — G20A1 Parkinson's disease without dyskinesia, without mention of fluctuations: Secondary | ICD-10-CM | POA: Diagnosis not present

## 2022-03-20 DIAGNOSIS — L89151 Pressure ulcer of sacral region, stage 1: Secondary | ICD-10-CM

## 2022-03-20 DIAGNOSIS — R569 Unspecified convulsions: Secondary | ICD-10-CM | POA: Diagnosis not present

## 2022-03-20 DIAGNOSIS — R4182 Altered mental status, unspecified: Secondary | ICD-10-CM | POA: Diagnosis not present

## 2022-03-20 MED ORDER — LORAZEPAM 0.5 MG PO TABS: 0.5000 mg | ORAL_TABLET | Freq: Two times a day (BID) | ORAL | 0 refills | Status: AC | PRN

## 2022-03-20 MED ORDER — QUETIAPINE FUMARATE 25 MG PO TABS: 12.5000 mg | ORAL_TABLET | Freq: Every day | ORAL | 1 refills | Status: AC

## 2022-03-20 NOTE — Discharge Summary (Addendum)
Physician Discharge Summary   Patient: Jasmine Mcdonald MRN: [id number] DOB: 30-Mar-1951  Admit date:     03/18/2022  Discharge date: 03/20/22  Discharge Physician: Barton Dubois   PCP: Cletis Athens, MD   Recommendations at discharge:  Repeat basic metabolic panel to follow electrolytes and renal function. Recommend discussion with palliative care for advance care planning and review goals of care. -palliative care consult and hospice enrolment strongly recommend it.   Discharge Diagnoses: Principal Problem:   AMS (altered mental status) Active Problems:   Parkinson's disease   Seizure ([place])   Atrial fibrillation ([place]) Parkinson's dementia Anxiety Stage I decubitus pressure injury  Brief Hospital admission course: As per H&P written by Dr. Denton Brick on 03/18/2022 Jasmine Mcdonald is a 71 y.o. female with medical history significant for Parkinson's disease, bedbound, atrial fibrillation on chronic anticoagulation, anxiety and thyroid disease, hypertension. Patient was brought to the ED from [facility] reports of altered mental status.  At the time of my evaluation, patient is awake, alert, she is able to answer some questions, but the volume and tone of her speech is quite low.  She sometimes whispers. Patient's daughter Jasmine Mcdonald is at bedside.  She reports at baseline, patient is bedbound, she also has contractures to her lower extremities, recently, patient has appeared foggy, just staring into space-she is unable to give a timeframe for this, but she reports today, patient was not talking as per usual.  She has good days and bad days, sometimes she would answer questions, and talk,.  But today patient has barely talked, diffuse what she was able to get from patient were whispered.  Patient is able to tell me her name, she denies pain, tells me she is in the hospital because she is not "stable".    Prolonged hospitalization at [facility] 3/17 to 09/18/2021-for seizure disorder, and  dysautonomia with hypertension.  She was started on Vimpat 100 mg twice daily.  Also reported significant dyskinesia on higher doses of Sinemet hence dose was reduced to 1 tablet 3 times daily. Also hospitalization 2/25 to 3/3 at [facility] for drug-induced dyskinesia, had extensive work-up including lumbar puncture.  Assessment and Plan: * AMS (altered mental status) -Reports of reduced verbal response prior to admission.  -Family also reporting patient stared into space and being foggy/slow to respond. -Prior history of seizure; no convulsive episodes appreciated.   -EEG negative and metabolic work-up unremarkable -MRI head not demonstrating acute intracranial abnormalities. -Case discussed with neurology service who has recommended continue current medications and to use low dose seroquel QHS (as previously advise in the outpatient setting). -Continue outpatient follow-up with her primary neurologist. -Continue current anticoagulation therapy for secondary prevention.  Atrial fibrillation ([place]) -On chronic anticoagulation with Eliquis and on metoprolol for rate control. -Stable heart rate appreciated on telemetry.   Seizure ([place]) -Continue lacosamide -EEG not demonstrating epileptic waves or acute abnormalities.  Parkinson's disease -Bedbound at baseline.   -Continue low-dose Sinemet as previously prescribed. -Physical therapy, assistance and supportive care as per the skilled nursing facility protocol. -Patient high risk for aspiration; dysphagia 3 diet with thin liquids when fully awake and in upright position recommended.  Stage I decubitus pressure injury: -Present at time of admission -Continue constant repositioning -No signs of superimposed infection.  Consultants: Neurology service Procedures performed: See below for x-ray reports Disposition: Skilled nursing facility (long-term resident). Diet recommendation: Dysphagia 3 diet with thin liquids.  DISCHARGE  MEDICATION: Allergies as of 03/20/2022   No Known Allergies  Medication List     TAKE these medications    carbidopa-levodopa 25-100 MG tablet Commonly known as: SINEMET IR Take 1 tablet by mouth 3 (three) times daily.   Eliquis 5 MG Tabs tablet Generic drug: apixaban Take 5 mg by mouth 2 (two) times daily.   Lacosamide 100 MG Tabs Take 100 mg by mouth 2 (two) times daily.   levothyroxine 100 MCG tablet Commonly known as: SYNTHROID TAKE 1 TABLET BY MOUTH EVERY DAY What changed: when to take this   LORazepam 0.5 MG tablet Commonly known as: ATIVAN Take 1 tablet (0.5 mg total) by mouth every 12 (twelve) hours as needed for anxiety or sleep. What changed: when to take this   metoprolol tartrate 50 MG tablet Commonly known as: LOPRESSOR TAKE 1 TABLET BY MOUTH TWICE A DAY   mirtazapine 45 MG tablet Commonly known as: REMERON Take 45 mg by mouth at bedtime.   multivitamin capsule Take 1 capsule by mouth daily.   ondansetron 4 MG tablet Commonly known as: Zofran Take 1 tablet (4 mg total) by mouth every 8 (eight) hours as needed. What changed: reasons to take this   QUEtiapine 25 MG tablet Commonly known as: SEROquel Take 0.5 tablets (12.5 mg total) by mouth at bedtime.   sertraline 50 MG tablet Commonly known as: ZOLOFT Take 50 mg by mouth daily.        Discharge Exam: Filed Weights   03/18/22 2001  Weight: 48.5 kg   General exam: Alert, awake, slow to respond but able to follow simple commands.  No fever, no chest pain, no nausea or vomiting. Respiratory system: Clear to auscultation. Respiratory effort normal.  Good saturation on room air. Cardiovascular system: Rate controlled, no rubs, no gallops, no JVD. Gastrointestinal system: Abdomen is nondistended, soft and nontender. No organomegaly or masses felt. Normal bowel sounds heard. Central nervous system: No new focal deficit. Extremities: No cyanosis or clubbing; chronic atrophic and contracted  changes on her extremities appreciated. Skin: No petechiae; a stage I decubitus pressure injury present at time of admission without signs of superimposed infection. Psychiatry: Stable mood.   Condition at discharge: Stable and in no acute distress.  The results of significant diagnostics from this hospitalization (including imaging, microbiology, ancillary and laboratory) are listed below for reference.   Imaging Studies: EEG adult  Result Date: Apr 08, 2022 Lora Havens, MD     Apr 08, 2022  6:38 PM Patient Name: Jasmine Mcdonald MRN: [id number] Epilepsy Attending: Lora Havens Referring Physician/Provider: Date: 04/08/22 Duration: 22.24 mins Patient history: 71 year old female with multiple medical comorbidities as noted above who presented with worsening mental status and possibly staring off. EEG to evaluate for seizure Level of alertness: Awake, asleep AEDs during EEG study: LCM Technical aspects: This EEG study was done with scalp electrodes positioned according to the 10-20 International system of electrode placement. Electrical activity was reviewed with band pass filter of 1-70Hz , sensitivity of 7 uV/mm, display speed of 77mm/sec with a 60Hz  notched filter applied as appropriate. EEG data were recorded continuously and digitally stored.  Video monitoring was available and reviewed as appropriate. Description: The posterior dominant rhythm consists of 8 Hz activity of moderate voltage (25-35 uV) seen predominantly in posterior head regions, symmetric and reactive to eye opening and eye closing. Sleep was characterized by vertex waves, sleep spindles (12 to 14 Hz), maximal frontocentral region. Hyperventilation and photic stimulation were not performed.   IMPRESSION: This study is within normal limits. No seizures or epileptiform discharges were  seen throughout the recording. A normal interictal EEG does not exclude the diagnosis of epilepsy. Lora Havens   ECHOCARDIOGRAM  COMPLETE  Result Date: 03/19/2022    ECHOCARDIOGRAM REPORT   Patient Name:   Jasmine Mcdonald Date of Exam: 03/19/2022 Medical Rec #:  [id number]        Height:       66.0 in Accession #:    [id number]       Weight:       106.9 lb Date of Birth:  [birth year]        BSA:          1.533 m Patient Age:    7 years         BP:           166/96 mmHg Patient Gender: F                HR:           66 bpm. Exam Location:  [facility] Procedure: 2D Echo, Cardiac Doppler and Color Doppler Indications:    I63.9 Stroke  History:        Patient has no prior history of Echocardiogram examinations.                 Arrythmias:Atrial Fibrillation; Risk Factors:Hypertension. AMS                 (altered mental status), Parkinson's disease.  Sonographer:    Alvino Chapel RCS Referring Phys: [id number] [place]  1. Left ventricular ejection fraction, by estimation, is 65 to 70%. The left ventricle has normal function. The left ventricle has no regional wall motion abnormalities. Left ventricular diastolic parameters are consistent with Grade I diastolic dysfunction (impaired relaxation).  2. Right ventricular systolic function is normal. The right ventricular size is normal. Tricuspid regurgitation signal is inadequate for assessing PA pressure.  3. The mitral valve is grossly normal. Trivial mitral valve regurgitation. No evidence of mitral stenosis.  4. The aortic valve is tricuspid. There is mild calcification of the aortic valve. There is mild thickening of the aortic valve. Aortic valve regurgitation is not visualized. Aortic valve sclerosis/calcification is present, without any evidence of aortic stenosis.  5. The inferior vena cava is normal in size with <50% respiratory variability, suggesting right atrial pressure of 8 mmHg. Conclusion(s)/Recommendation(s): No intracardiac source of embolism detected on this transthoracic study. Consider a transesophageal echocardiogram to exclude cardiac source of embolism  if clinically indicated. FINDINGS  Left Ventricle: Left ventricular ejection fraction, by estimation, is 65 to 70%. The left ventricle has normal function. The left ventricle has no regional wall motion abnormalities. The left ventricular internal cavity size was normal in size. There is  no left ventricular hypertrophy. Left ventricular diastolic parameters are consistent with Grade I diastolic dysfunction (impaired relaxation). Right Ventricle: The right ventricular size is normal. No increase in right ventricular wall thickness. Right ventricular systolic function is normal. Tricuspid regurgitation signal is inadequate for assessing PA pressure. Left Atrium: Left atrial size was normal in size. Right Atrium: Right atrial size was normal in size. Pericardium: There is no evidence of pericardial effusion. Mitral Valve: The mitral valve is grossly normal. Trivial mitral valve regurgitation. No evidence of mitral valve stenosis. Tricuspid Valve: The tricuspid valve is grossly normal. Tricuspid valve regurgitation is trivial. No evidence of tricuspid stenosis. Aortic Valve: The aortic valve is tricuspid. There is mild calcification of the aortic valve. There is mild thickening of  the aortic valve. Aortic valve regurgitation is not visualized. Aortic valve sclerosis/calcification is present, without any evidence of aortic stenosis. Pulmonic Valve: The pulmonic valve was grossly normal. Pulmonic valve regurgitation is trivial. No evidence of pulmonic stenosis. Aorta: The aortic root is normal in size and structure. Venous: The inferior vena cava is normal in size with less than 50% respiratory variability, suggesting right atrial pressure of 8 mmHg. IAS/Shunts: The atrial septum is grossly normal.  LEFT VENTRICLE PLAX 2D LVIDd:         4.30 cm   Diastology LVIDs:         1.90 cm   LV e' medial:    4.13 cm/s LV PW:         1.10 cm   LV E/e' medial:  13.8 LV IVS:        1.10 cm   LV e' lateral:   7.94 cm/s LVOT diam:      1.60 cm   LV E/e' lateral: 7.2 LV SV:         65 LV SV Index:   42 LVOT Area:     2.01 cm  RIGHT VENTRICLE RV S prime:     15.20 cm/s TAPSE (M-mode): 3.0 cm LEFT ATRIUM             Index        RIGHT ATRIUM           Index LA diam:        3.50 cm 2.28 cm/m   RA Area:     14.10 cm LA Vol (A2C):   54.2 ml 35.37 ml/m  RA Volume:   31.70 ml  20.68 ml/m LA Vol (A4C):   81.3 ml 53.05 ml/m LA Biplane Vol: 67.8 ml 44.24 ml/m  AORTIC VALVE LVOT Vmax:   145.00 cm/s LVOT Vmean:  93.100 cm/s LVOT VTI:    0.323 m  AORTA Ao Root diam: 3.10 cm MITRAL VALVE MV Area (PHT): 1.36 cm    SHUNTS MV Decel Time: 556 msec    Systemic VTI:  0.32 m MV E velocity: 57.00 cm/s  Systemic Diam: 1.60 cm MV A velocity: 93.40 cm/s MV E/A ratio:  0.61 Eleonore Chiquito MD Electronically signed by Eleonore Chiquito MD Signature Date/Time: 03/19/2022/2:49:08 PM    Final    MR ANGIO HEAD WO CONTRAST  Result Date: 03/19/2022 CLINICAL DATA:  71 year old female with altered mental status and bradycardia. Age indeterminate right thalamic infarct on head CT yesterday. EXAM: MRA HEAD WITHOUT CONTRAST TECHNIQUE: Angiographic images of the Circle of Willis were acquired using MRA technique without intravenous contrast. COMPARISON:  Brain MRI and neck MRA the same day reported separately. FINDINGS: Anterior circulation: Mild motion artifact at the skull base. Antegrade flow in both ICA siphons. Left siphon appears mildly dominant along with the left ACA A1 segment. No convincing siphon stenosis. Patent carotid termini, MCA and ACA origins. Ophthalmic artery origins appear normal. Anterior communicating artery is normal. MCA M1 segments and bifurcations appear patent without stenosis. Visible bilateral MCA and ACA branches are within normal limits. Posterior circulation: Antegrade flow in the posterior circulation with no distal vertebral or vertebrobasilar junction stenosis. Right V4 segment is non dominant beyond the patent right PICA origin. Patent basilar  artery without stenosis. Basilar tip, SCA and PCA origins are patent. Posterior communicating arteries are diminutive or absent. Bilateral PCA branches are within normal limits. Anatomic variants: Mildly dominant left ICA siphon, left vertebral V4 segment. Other: Brain MRI today reported separately.  IMPRESSION: Negative intracranial MRA. Electronically Signed   By: Genevie Ann M.D.   On: 03/19/2022 11:37   MR ANGIO NECK W WO CONTRAST  Result Date: 03/19/2022 CLINICAL DATA:  71 year old female with altered mental status and bradycardia. Age indeterminate right thalamic infarct on head CT yesterday. EXAM: MRA NECK WITHOUT AND WITH CONTRAST TECHNIQUE: Multiplanar and multiecho pulse sequences of the neck were obtained without and with intravenous contrast. Angiographic images of the neck were obtained using MRA technique without and with intravenous contrast. CONTRAST:  93mL GADAVIST GADOBUTROL 1 MMOL/ML IV SOLN COMPARISON:  Brain MRI today. FINDINGS: Precontrast time-of-flight images demonstrate antegrade flow in both cervical carotid and vertebral arteries to the skull base. Vertebral arteries appear codominant. The left carotid bifurcation appears negative. Asymmetric irregularity at the right carotid bifurcation although may be motion artifact. Post-contrast neck MRA images reveal a 3 vessel arch configuration. Tortuous brachiocephalic artery, proximal right CCA and subclavian. Aside from tortuosity the right CCA appears normal, and the right carotid bifurcation appears patent and normal on series 1003, image 3. Tortuosity of the right ICA distal to the bulb, but no stenosis to the skull base. In the visible right ICA siphon and terminus appear normal. Right ACA A1 segment appears non dominant. Similar tortuosity of the left CCA without stenosis. Negative left carotid bifurcation. Mild tortuosity of the left ICA distal to the bulb. No stenosis to the skull base. Visible left ICA siphon and terminus appear negative.  Proximal subclavian arteries and vertebral artery origins are patent. Both V1 segments are tortuous, and the right V1 segment detail is obscured. But otherwise both cervical vertebral arteries appear patent and within normal limits to the skull base with mild tortuosity. The left vertebral is mildly dominant. The right vertebral is non dominant beyond the right PICA origin. Vertebrobasilar junction is patent. IMPRESSION: Negative Neck MRA aside from generalized arterial tortuosity. Electronically Signed   By: Genevie Ann M.D.   On: 03/19/2022 11:25   MR BRAIN WO CONTRAST  Result Date: 03/19/2022 CLINICAL DATA:  71 year old female with altered mental status and bradycardia. Age indeterminate right thalamic infarct on head CT yesterday. EXAM: MRI HEAD WITHOUT CONTRAST TECHNIQUE: Multiplanar, multiecho pulse sequences of the brain and surrounding structures were obtained without intravenous contrast. COMPARISON:  Head CT yesterday. FINDINGS: Brain: Fairly symmetric T2 heterogeneity in the bilateral thalami, no discrete lesion in the right thalamus as was suspected by CT (presumed artifact on that exam). No restricted diffusion to suggest acute infarction. No midline shift, mass effect, evidence of mass lesion, ventriculomegaly, extra-axial collection or acute intracranial hemorrhage. Cervicomedullary junction and pituitary are within normal limits. Patchy and confluent but nonspecific bilateral cerebral white matter T2 and FLAIR hyperintensity. Bilateral temporal lobe involvement. Small area of right parietal lobe cortical encephalomalacia on series 16, image 31. No chronic cerebral blood products on T2 * imaging. Only mild T2 heterogeneity in the bilateral deep gray nuclei. Mild to moderate T2 heterogeneity in the pons. Cerebellum appears negative aside from nonspecific volume loss. Vascular: Major intracranial vascular flow voids are preserved. Skull and upper cervical spine: Visualized bone marrow signal is within  normal limits. Partially visible cervical spine degeneration. Sinuses/Orbits: Negative orbits. Paranasal Visualized paranasal sinuses and mastoids are stable and well aerated. Other: Grossly normal visible internal auditory structures. Negative visible scalp and face. IMPRESSION: 1. No acute intracranial abnormality, and no discrete right thalamic lesion - with artifact now suspected on the CT there yesterday. 2. But chronic small vessel disease is suspected in  relation to moderately advanced signal changes in the cerebral white matter and pons, with a subtle area of chronic cortical encephalomalacia in the right parietal lobe. Electronically Signed   By: Genevie Ann M.D.   On: 03/19/2022 11:14   CT Head Wo Contrast  Result Date: 03/18/2022 CLINICAL DATA:  71 year old female with altered mental status and bradycardia. Unresponsive. EXAM: CT HEAD WITHOUT CONTRAST TECHNIQUE: Contiguous axial images were obtained from the base of the skull through the vertex without intravenous contrast. RADIATION DOSE REDUCTION: This exam was performed according to the departmental dose-optimization program which includes automated exposure control, adjustment of the mA and/or kV according to patient size and/or use of iterative reconstruction technique. COMPARISON:  Head CT 07/27/2021 and earlier. FINDINGS: Brain: Stable cerebral volume. No midline shift, ventriculomegaly, mass effect, evidence of mass lesion, intracranial hemorrhage or evidence of cortically based acute infarction. Confluent bilateral cerebral white matter hypodensity appears stable and slightly greater in the left hemisphere. New small round hypodensity in the right thalamus since February (series 4, image 41) is age indeterminate. Contralateral left basal ganglia hypodensity appears stable (coronal image 39). Otherwise stable gray-white differentiation. Vascular: Calcified atherosclerosis at the skull base. No suspicious intracranial vascular hyperdensity. Skull:  No acute osseous abnormality identified. Sinuses/Orbits: Visualized paranasal sinuses and mastoids are stable and well aerated. Trace bubbly opacity in the right sphenoid sinus now. Other: Visualized orbits and scalp soft tissues are within normal limits. IMPRESSION: 1. Age indeterminate right thalamic lacunar infarct, new since February. 2. Otherwise stable underlying chronic white matter disease. No other acute intracranial abnormality. Electronically Signed   By: Genevie Ann M.D.   On: 03/18/2022 12:29    Microbiology: Results for orders placed or performed during the hospital encounter of 03/18/22  SARS Coronavirus 2 by RT PCR (hospital order, performed in [facility] hospital lab) *cepheid single result test* Anterior Nasal Swab     Status: None   Collection Time: 03/18/22 12:29 PM   Specimen: Anterior Nasal Swab  Result Value Ref Range Status   SARS Coronavirus 2 by RT PCR NEGATIVE NEGATIVE Final    Comment: (NOTE) SARS-CoV-2 target nucleic acids are NOT DETECTED.  The SARS-CoV-2 RNA is generally detectable in upper and lower respiratory specimens during the acute phase of infection. The lowest concentration of SARS-CoV-2 viral copies this assay can detect is 250 copies / mL. A negative result does not preclude SARS-CoV-2 infection and should not be used as the sole basis for treatment or other patient management decisions.  A negative result may occur with improper specimen collection / handling, submission of specimen other than nasopharyngeal swab, presence of viral mutation(s) within the areas targeted by this assay, and inadequate number of viral copies (<250 copies / mL). A negative result must be combined with clinical observations, patient history, and epidemiological information.  Fact Sheet for Patients:   https://www.patel.info/  Fact Sheet for Healthcare Providers: https://hall.com/  This test is not yet approved or  cleared by the  [place] FDA and has been authorized for detection and/or diagnosis of SARS-CoV-2 by FDA under an Emergency Use Authorization (EUA).  This EUA will remain in effect (meaning this test can be used) for the duration of the COVID-19 declaration under Section 564(b)(1) of the Act, 21 U.S.C. section 360bbb-3(b)(1), unless the authorization is terminated or revoked sooner.  Performed at [facility], [address], [place], [place] [address]     Labs: CBC: Recent Labs  Lab 03/18/22 1202  WBC 4.5  HGB 10.5*  HCT 32.6*  MCV 102.8*  PLT [id number]   Basic Metabolic Panel: Recent Labs  Lab 03/18/22 1202  NA 141  K 3.9  CL 109  CO2 28  GLUCOSE 99  BUN 31*  CREATININE 0.99  CALCIUM 9.5   Liver Function Tests: Recent Labs  Lab 03/18/22 1202  AST 14*  ALT <5  ALKPHOS 68  BILITOT 0.5  PROT 5.7*  ALBUMIN 3.3*   CBG: Recent Labs  Lab 03/18/22 1158 03/18/22 2118  GLUCAP 91 98    Discharge time spent: greater than 30 minutes.  Signed: Barton Dubois, MD [facility] 03/20/2022

## 2022-03-20 NOTE — Progress Notes (Signed)
SLP Cancellation Note  Patient Details Name: Jasmine Mcdonald MRN: [id number] DOB: [birth year]   Cancelled treatment:       Reason Eval/Treat Not Completed: Fatigue/lethargy limiting ability to participate   Elvina Sidle, M.S., CCC-SLP 03/20/2022, 11:07 AM

## 2022-03-20 NOTE — TOC Transition Note (Signed)
Transition of Care [facility]) - CM/SW Discharge Note   Patient Details  Name: Jasmine Mcdonald MRN: [id number] Date of Birth: 01-28-1951  Transition of Care [facility]) CM/SW Contact:  Salome Arnt, [place] Phone Number: 03/20/2022, 3:14 PM   Clinical Narrative: Pt d/c today back to [facility]. Discussed with son, Thurmond Butts who is agreeable. Facility notified. No FL2 needed per SNF as pt in observation. Pt will transport via [facility]. RN given number to call report. D/C summary sent to SNF.       Final next level of care: Long Term Nursing Home Barriers to Discharge: Barriers Resolved   Patient Goals and CMS Choice Patient states their goals for this hospitalization and ongoing recovery are:: to return to LTC CMS Medicare.gov Compare Post Acute Care list provided to:: Patient Represenative (must comment) Choice offered to / list presented to : Adult Children  Discharge Placement                Patient to be transferred to facility by: [facility] Name of family member notified: Thurmond Butts, son Patient and family notified of of transfer: 03/20/22  Discharge Plan and Services     Post Acute Care Choice: Long Term Acute Care (LTAC)                               Social Determinants of Health (SDOH) Interventions     Readmission Risk Interventions     No data to display

## 2022-03-20 NOTE — Progress Notes (Signed)
IV removed, patient ready for discharge.   Spoke with son Thurmond Butts to inform of D/C  Report given to Vicente Males at [facility]

## 2022-09-24 ENCOUNTER — Non-Acute Institutional Stay: Payer: Medicaid Other | Admitting: Nurse Practitioner

## 2022-09-24 DIAGNOSIS — Z515 Encounter for palliative care: Secondary | ICD-10-CM

## 2022-09-24 DIAGNOSIS — R5381 Other malaise: Secondary | ICD-10-CM

## 2022-09-24 DIAGNOSIS — G20B2 Parkinson's disease with dyskinesia, with fluctuations: Secondary | ICD-10-CM

## 2022-09-24 NOTE — Progress Notes (Signed)
Therapist, nutritional Palliative Care Consult Note Telephone: [phone]  Fax: [phone]    Date of encounter: 09/24/22 9:20 PM PATIENT NAME: Jasmine Mcdonald [address] [place] [address]   [phone] (home)  DOB: 04/03/1951 MRN: [id number] PRIMARY CARE PROVIDER:    [facility]  RESPONSIBLE PARTY:    Contact Information     Name Relation Home Work [place] Son   [phone]   Jasmine Mcdonald [phone]    Jasmine Mcdonald Daughter   [phone]        [facility] Pall[facility] Consult Note Telephone: [phone]  Fax: [phone]   Date of encounter: 09/24/22 9:21 PM PATIENT NAME: Jasmine Mcdonald [address] [place] [address]   [phone] (home)  DOB: 1951/05/04 MRN: [id number] PRIMARY CARE PROVIDER:    [facility]cd[facility], LTC, [place]  RESPONSIBLE PARTY:    Contact Information     Name Relation Home Work Mobile   [place] Son   [phone]   Jaclynn, [place] [phone]    Jasmine Mcdonald Daughter   [phone]      I met face to face with patient in facility. Palliative Care was asked to follow this patient by consultation request of  Jasmine Mcdonald, to address advance care planning and complex medical decision making. This is the initial visit.        ASSESSMENT AND PLAN / RECOMMENDATIONS:  Symptom Management/Plan: 1. Advance Care Planning;  Recently d/c from hospice due to stability; will clarify with family  2. Goals of Care: Goals include to maximize quality of life and symptom management. Our advance care planning conversation included a discussion about:    The value and importance of advance care planning  Exploration of personal, cultural or spiritual beliefs that might influence medical decisions  Exploration of goals of care in the event of a sudden injury or illness   Identification and preparation of a healthcare agent  Review and updating or creation of an advance directive document. 3. Palliative care encounter; Palliative care encounter; Palliative medicine team will continue to support patient, patient's family, and medical team. Visit consisted of counseling and education dealing with the complex and emotionally intense issues of symptom management and palliative care in the setting of serious and potentially life-threatening illness  4. Debility secondary to Parkinson disease/anorexia; will continue to monitor weights, reviewed. Will continue supportive care focused on comfort, encourage to get OOB; monitor s/s, currently stable, asymptomatic, monitor functional/cognitive abilities. 09/12/2022 weight 103 lbs  Follow up Palliative Care Visit: Palliative care will continue to follow for complex medical decision making, advance care planning, and clarification of goals. Return 2 tp 8 weeks or prn.  I spent 46 minutes providing this consultation. More than 50% of the time in this consultation was spent in counseling and care coordination. PPS: 30%  Chief Complaint: Initial palliative consult for complex medical decision making, address goals, manage ongoing symptoms  HISTORY OF PRESENT ILLNESS:  Jasmine Mcdonald is a 72 y.o. year old female  with multiple medical problems including parkinson disease, dementia, afib, seizure, tyroid disease, HTN, anxiety. Jasmine Mcdonald does reside at LTC at [facility], [place], requires assistance with transfers, adl's, feeds herself with declined appetite. Recently d/c from hospice services due to debility. Purpose of today PC f/u visit further discussion monitor trends of appetite, weights, monitor for functional, cognitive decline with chronic disease progression, assess any active symptoms, supportive role. At  present Jasmine Mcdonald is lying in bed, appears debilitated, appears comfortable. No visitors present. We talked about  ros, functional abilities. We talked about residing at facility. We talked about mobility, getting oob. We talked about quality of life. Jasmine Mcdonald had a very soft voice, difficult to understand at times. Jasmine Mcdonald was cooperative with assessment, support provided. Medications, poc, goc reviewed. PC f/u visit further discussion monitor trends of appetite, weights, monitor for functional, cognitive decline with chronic disease progression, assess any active symptoms, supportive role.  History obtained from review of EMR, discussion with primary team, and interview with family, facility staff/caregiver and/or Jasmine. Mcdonald.  I reviewed available labs, medications, imaging, studies and related documents from the EMR.  Records reviewed and summarized above.  Physical Exam: Constitutional: NAD General: frail appearing, thin, very soft spoken female ENMT: oral mucous membranes moist CV: S1S2, RRR, no LE edema Pulmonary: breath sounds clear Abdomen: normo-active BS + 4 quadrants, soft and non tender Skin: warm and dry Neuro:  + generalized weakness,  + cognitive impairment Psych: non-anxious affect, A and O x 3 CURRENT PROBLEM LIST:  Patient Active Problem List   Diagnosis Date Noted   AMS (altered mental status) 03/18/2022   Seizure 03/18/2022   Atrial fibrillation 03/18/2022   Acute paranoid reaction 07/25/2021   Parkinson's disease 06/22/2021   PAST MEDICAL HISTORY:  Active Ambulatory Problems    Diagnosis Date Noted   Parkinson's disease 06/22/2021   Acute paranoid reaction 07/25/2021   AMS (altered mental status) 03/18/2022   Seizure 03/18/2022   Atrial fibrillation 03/18/2022   Resolved Ambulatory Problems    Diagnosis Date Noted   No Resolved Ambulatory Problems   Past Medical History:  Diagnosis Date   Anxiety    Hypertension    Thyroid disease    ALLERGIES: No Known Allergies   PERTINENT MEDICATIONS:  Outpatient Encounter Medications as of 09/24/2022  Medication Sig    carbidopa-levodopa (SINEMET IR) 25-100 MG tablet Take 1 tablet by mouth 3 (three) times daily.   ELIQUIS 5 MG TABS tablet Take 5 mg by mouth 2 (two) times daily.   Lacosamide 100 MG TABS Take 100 mg by mouth 2 (two) times daily.   levothyroxine (SYNTHROID) 100 MCG tablet TAKE 1 TABLET BY MOUTH EVERY DAY (Patient taking differently: Take 100 mcg by mouth daily before breakfast.)   LORazepam (ATIVAN) 0.5 MG tablet Take 1 tablet (0.5 mg total) by mouth every 12 (twelve) hours as needed for anxiety or sleep.   metoprolol tartrate (LOPRESSOR) 50 MG tablet TAKE 1 TABLET BY MOUTH TWICE A DAY (Patient taking differently: Take 50 mg by mouth 2 (two) times daily.)   mirtazapine (REMERON) 45 MG tablet Take 45 mg by mouth at bedtime.   Multiple Vitamin (MULTIVITAMIN) capsule Take 1 capsule by mouth daily.   ondansetron (ZOFRAN) 4 MG tablet Take 1 tablet (4 mg total) by mouth every 8 (eight) hours as needed. (Patient taking differently: Take 4 mg by mouth every 8 (eight) hours as needed for nausea or vomiting.)   QUEtiapine (SEROQUEL) 25 MG tablet Take 0.5 tablets (12.5 mg total) by mouth at bedtime.   sertraline (ZOLOFT) 50 MG tablet Take 50 mg by mouth daily.   No facility-administered encounter medications on file as of 09/24/2022.   Thank you for the opportunity to participate in the care of Jasmine. Wentzel.  The palliative care team will continue to follow. Please call our office at [phone] if we can be of additional assistance.   Aarav Burgett  Prince Rome, NP ,

## 2022-10-15 ENCOUNTER — Non-Acute Institutional Stay: Payer: Medicaid Other | Admitting: Nurse Practitioner

## 2022-10-15 ENCOUNTER — Encounter: Payer: Self-pay | Admitting: Nurse Practitioner

## 2022-10-15 DIAGNOSIS — Z515 Encounter for palliative care: Secondary | ICD-10-CM

## 2022-10-15 DIAGNOSIS — R630 Anorexia: Secondary | ICD-10-CM

## 2022-10-15 DIAGNOSIS — G20B2 Parkinson's disease with dyskinesia, with fluctuations: Secondary | ICD-10-CM

## 2022-10-15 DIAGNOSIS — R627 Adult failure to thrive: Secondary | ICD-10-CM

## 2022-10-15 DIAGNOSIS — R5381 Other malaise: Secondary | ICD-10-CM

## 2022-10-15 NOTE — Progress Notes (Signed)
Therapist, nutritional Palliative Care Consult Note Telephone: [phone]  Fax: [phone]    Date of encounter: 10/15/22 5:55 PM PATIENT NAME: Jasmine Mcdonald [address] [place] [address]   [phone] (home)  DOB: 13-Oct-1950 MRN: [id number] PRIMARY CARE PROVIDER:    [facility]  RESPONSIBLE PARTY:    Contact Information     Name Relation Home Work Mobile   [place] Son   [phone]   Jasmine Mcdonald, Jasmine Mcdonald [phone]    Jasmine Mcdonald,Jasmine Mcdonald Daughter   [phone]      I met face to face with patient in facility. Palliative Care was asked to follow this patient by consultation request of  [facility], to address advance care planning and complex medical decision making. This is the initial visit.        ASSESSMENT AND PLAN / RECOMMENDATIONS:  Symptom Management/Plan: 1. Advance Care Planning;  DNR Wishes are to continue with hospice evaluation due to following reasons:  >11 lbs loss since hospice d/c Overall functional decline, not getting oob, pps now 30%, previously 40%, unable to feed herself. Worsening dysphagia with eating <25% meals, refused to eat and family does not wish to explore other appetite stimulants, on remeron.  Jasmine Mcdonald is talking about dying, going to heaven, being tired.    2. Palliative care encounter; Palliative care encounter; Palliative medicine team will continue to support patient, patient's family, and medical team. Visit consisted of counseling and education dealing with the complex and emotionally intense issues of symptom management and palliative care in the setting of serious and potentially life-threatening illness   4. Anorexia, weight loss, failure to thrive with Debility secondary to Parkinson disease/anorexia; will continue to monitor weights, reviewed. Will continue supportive care focused on comfort, encourage to get OOB; monitor s/s, currently stable,  asymptomatic, monitor functional/cognitive abilities. 09/12/2022 weight 103 lbs Weight per staff documented at 92 lbs 10/15/2022 weight 97.4 lbs 11 lbs loss according to documented weight Staff was going to weigh today.   I spent 48 minutes providing this consultation. More than 50% of the time in this consultation was spent in counseling and care coordination. PPS: 30%   Chief Complaint: Initial palliative consult for complex medical decision making, address goals, manage ongoing symptoms   HISTORY OF PRESENT ILLNESS:  Jasmine Mcdonald is a 72 y.o. year old female  with multiple medical problems including parkinson disease, dementia, afib, seizure, tyroid disease, HTN, anxiety. Jasmine Mcdonald does reside at LTC at [facility], [place], requires assistance with transfers, adl's, feeds herself with declined appetite. Recently d/c from hospice services due to debility. Purpose of today PC f/u visit further discussion monitor trends of appetite, weights, monitor for functional, cognitive decline with chronic disease progression, assess any active symptoms, supportive role. At present Jasmine Mcdonald is lying in bed, appears debilitated, weak, comfortable with son Jasmine Mcdonald present. We talked about Jasmine Mcdonald declining to further eat, when she does <25% meals for the last 2 weeks. Jasmine Mcdonald has been expressing she is tired of fighting and wishes she could go to heaven. We talked about no SI just overall tired residing facility, noting body continues to overall decline, debility, muscle wasting. We talked about symptoms, functional ability prefers to say in bed, sleeping more. We talked about goc, option to revisit hospice services as Jasmine Mcdonald previously under hospice services then d/c due to stability. Ryan and Jasmine Mcdonald wishes to have hospice av evaluation. Notified [facility] SW and nursing,  will send order. Therapeutic listening, emotional support provided. Questions answered.    History obtained from review of  EMR, discussion with primary team, and interview with family, facility staff/caregiver and/or Jasmine. Mcdonald.  I reviewed available labs, medications, imaging, studies and related documents from the EMR.  Records reviewed and summarized above.  Physical Assessment: General: frail appearing, thin, weak pleasant female ENMT: oral mucous membranes moist CV: S1S2, RRR, no LE edema Pulmonary: breath sounds clear Abdomen: normo-active BS + 4 quadrants, soft and non tender Neuro:  + generalized weakness,  + cognitive impairment Psych: non-anxious affect, A and O x 2  Thank you for the opportunity to participate in the care of Jasmine Mcdonald. Please call our office at [phone] if we can be of additional assistance.   Maddisen Vought Prince Rome, NP

## 2023-05-18 IMAGING — CT CT HEAD W/O CM
4 series · 17 of 47 positions shown, 19 images · non-contrast
Comparison: 07/25/2021

CLINICAL DATA: Seizure, new-onset, no history of trauma.  Fall.



[Series 2: head wo · axial · 0.41mm/px · z∈[+350,+470]mm · 7 of 34 slices shown, 9 images]
[im 5/34  brain]
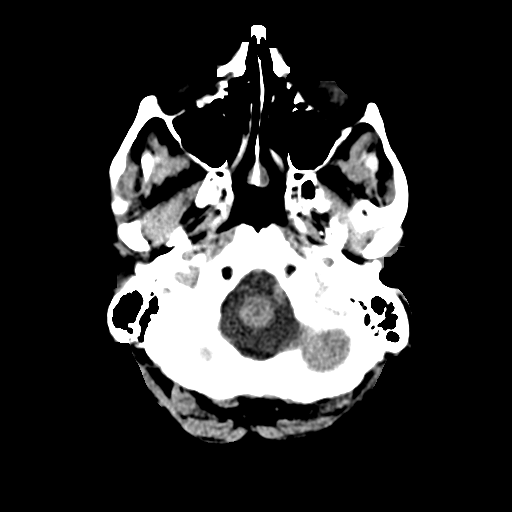
[im 5/34  bone]
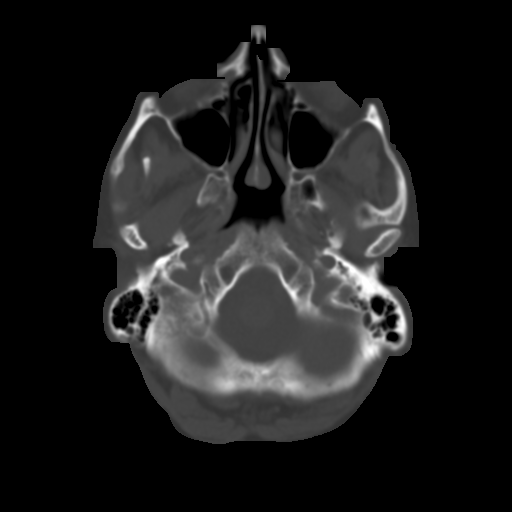
[im 9/34  brain]
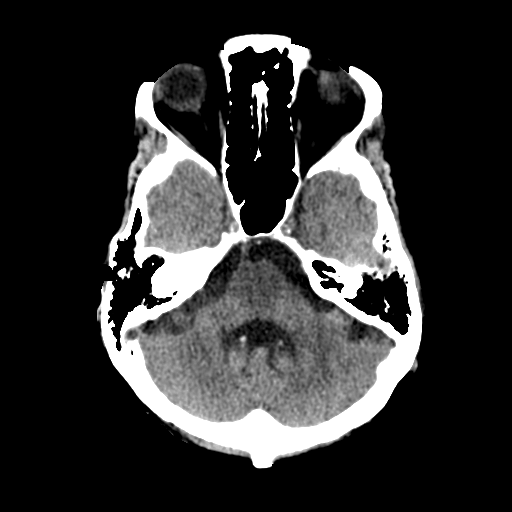
[im 13/34  brain]
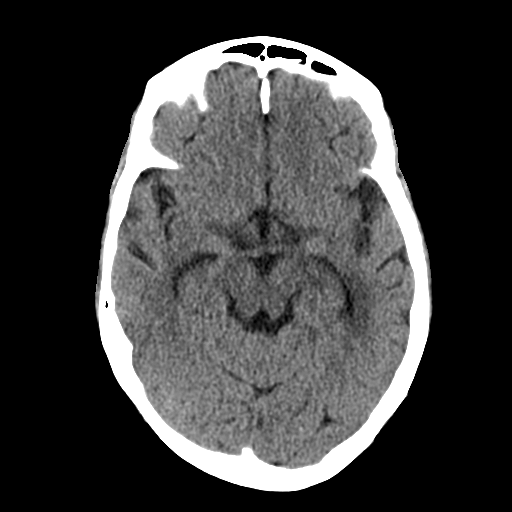
[im 17/34  brain]
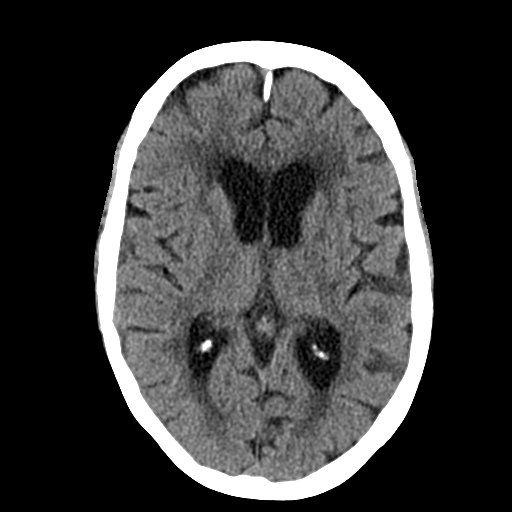
[im 21/34  brain]
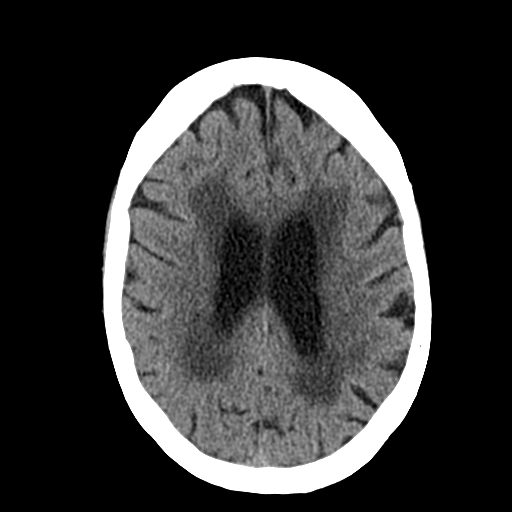
[im 21/34  bone]
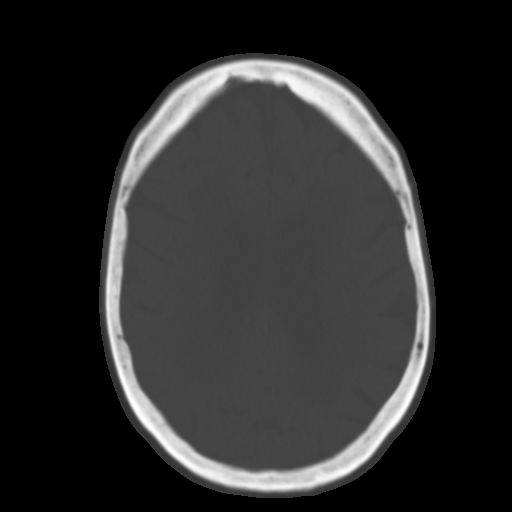
[im 25/34  brain]
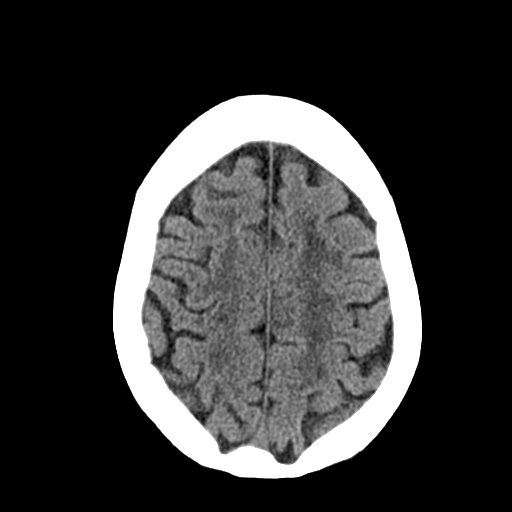
[im 29/34  brain]
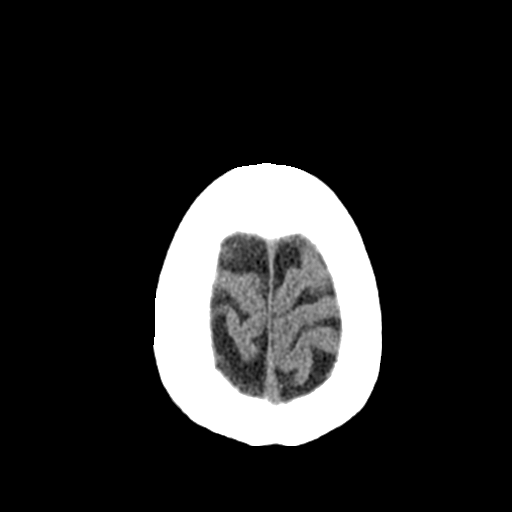

[Series 3: head bone · axial · 0.41mm/px · z∈[+346,+404]mm · 4 of 84 slices shown]
[im 9/84  bone]
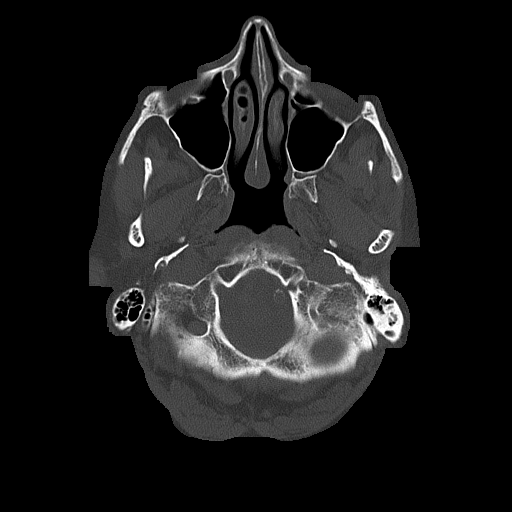
[im 17/84  bone]
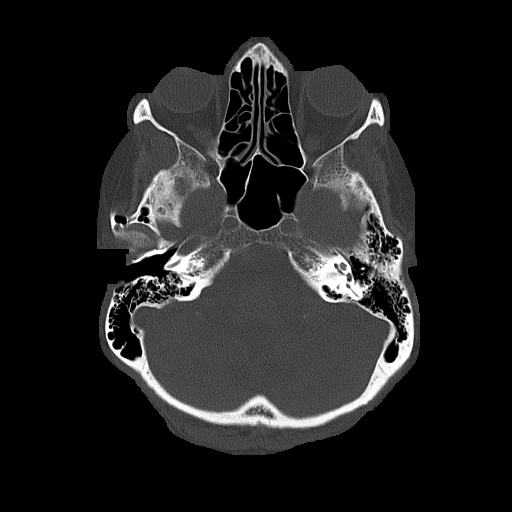
[im 25/84  bone]
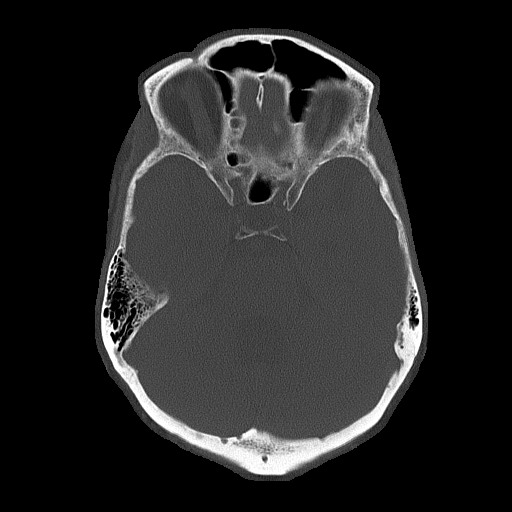
[im 38/84  bone]
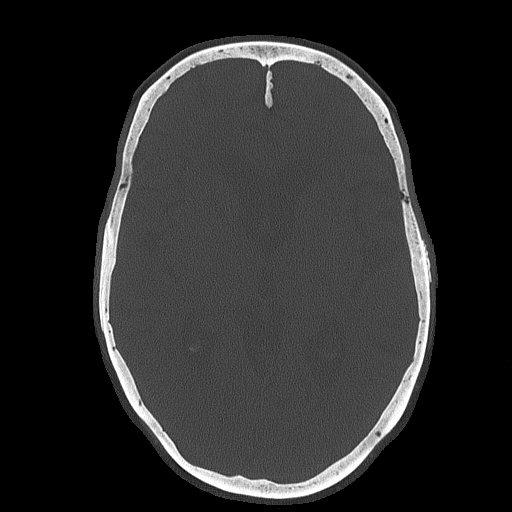

[Series 4: coronal soft tissue · coronal · 0.30mm/px · 3 of 70 slices shown]
[im 24/70  brain]
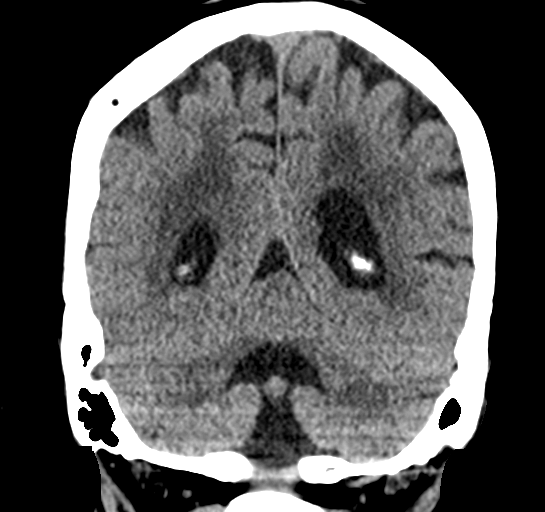
[im 31/70  brain]
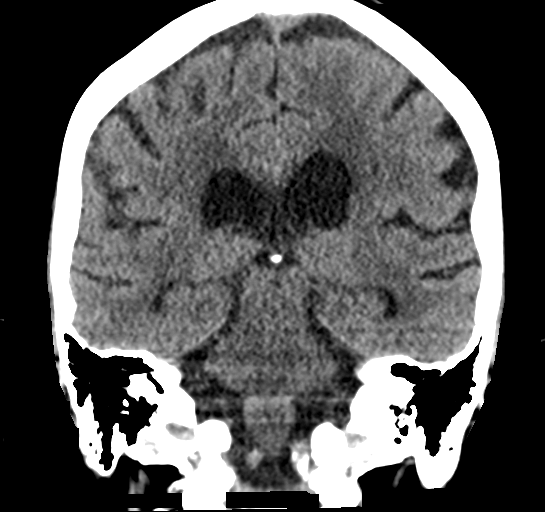
[im 39/70  brain]
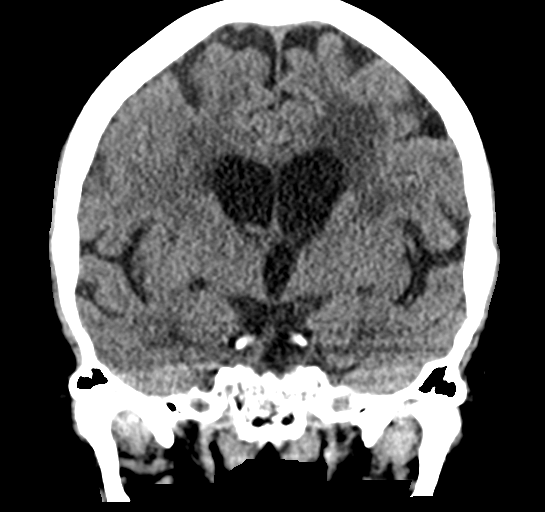

[Series 5: sagittal soft tissue · sagittal · 0.38mm/px · 3 of 55 slices shown]
[im 19/55  brain]
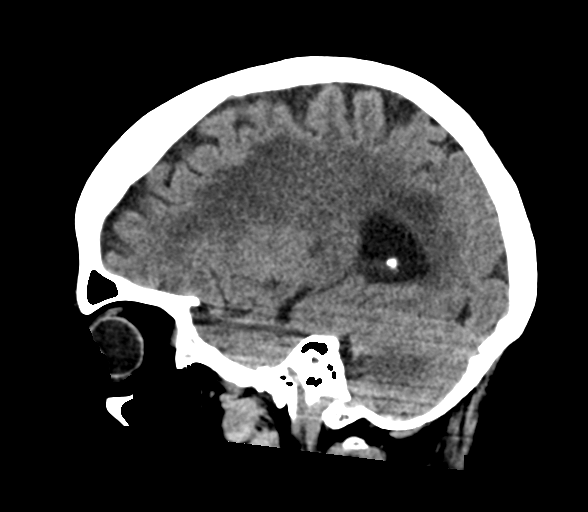
[im 28/55  brain]
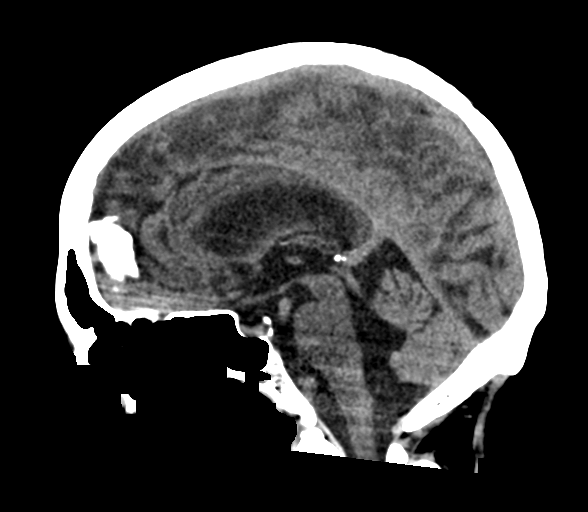
[im 37/55  brain]
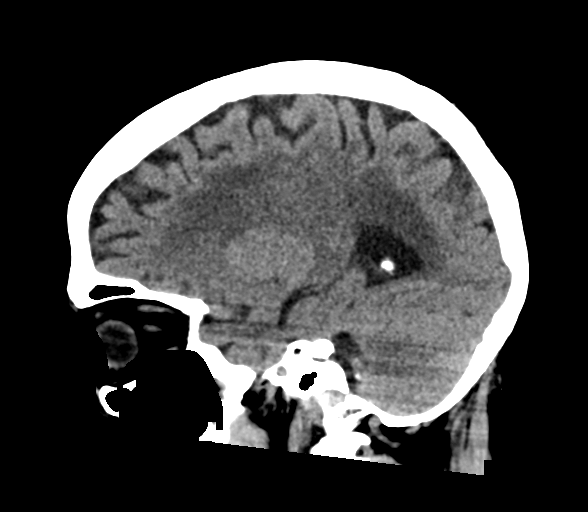

[17 of 47 positions shown; findings below may reference images not displayed]

FINDINGS: Brain: Normal anatomic configuration. Parenchymal volume loss is
commensurate with the patient's age. Moderate subcortical and
periventricular white matter changes are present likely reflecting
the sequela of small vessel ischemia. Remote infarct within the
right putamen again noted. No abnormal intra or extra-axial mass
lesion or fluid collection. No abnormal mass effect or midline
shift. No evidence of acute intracranial hemorrhage or infarct.
Ventricular size is normal. Cerebellum unremarkable.

Vascular: No asymmetric hyperdense vasculature at the skull base.

Skull: Intact

Sinuses/Orbits: Paranasal sinuses are clear. Orbits are
unremarkable.

Other: Mastoid air cells and middle ear cavities are clear.
IMPRESSION: Stable senescent changes. No acute intracranial hemorrhage or
infarct.

## 2024-06-24 ENCOUNTER — Emergency Department (HOSPITAL_COMMUNITY)
Admission: EM | Admit: 2024-06-24 | Discharge: 2024-06-24 | Disposition: A | Discharge status: Home / Self Care | Attending: Emergency Medicine | Admitting: Emergency Medicine

## 2024-06-24 ENCOUNTER — Other Ambulatory Visit: Payer: Self-pay

## 2024-06-24 ENCOUNTER — Encounter (HOSPITAL_COMMUNITY): Payer: Self-pay

## 2024-06-24 ENCOUNTER — Emergency Department (HOSPITAL_COMMUNITY)

## 2024-06-24 DIAGNOSIS — Z7982 Long term (current) use of aspirin: Secondary | ICD-10-CM | POA: Insufficient documentation

## 2024-06-24 DIAGNOSIS — G20C Parkinsonism, unspecified: Secondary | ICD-10-CM | POA: Insufficient documentation

## 2024-06-24 DIAGNOSIS — S0990XA Unspecified injury of head, initial encounter: Secondary | ICD-10-CM | POA: Diagnosis present

## 2024-06-24 DIAGNOSIS — S0083XA Contusion of other part of head, initial encounter: Secondary | ICD-10-CM

## 2024-06-24 DIAGNOSIS — S0031XA Abrasion of nose, initial encounter: Secondary | ICD-10-CM

## 2024-06-24 DIAGNOSIS — Z7901 Long term (current) use of anticoagulants: Secondary | ICD-10-CM | POA: Insufficient documentation

## 2024-06-24 DIAGNOSIS — W050XXA Fall from non-moving wheelchair, initial encounter: Secondary | ICD-10-CM | POA: Diagnosis not present

## 2024-06-24 DIAGNOSIS — S022XXA Fracture of nasal bones, initial encounter for closed fracture: Secondary | ICD-10-CM | POA: Insufficient documentation

## 2024-06-24 DIAGNOSIS — Z23 Encounter for immunization: Secondary | ICD-10-CM | POA: Insufficient documentation

## 2024-06-24 LAB — CBC WITH DIFFERENTIAL/PLATELET
Abs Immature Granulocytes: 0.03 K/uL (ref 0.00–0.07)
Basophils Absolute: 0 K/uL (ref 0.0–0.1)
Basophils Relative: 0 %
Eosinophils Absolute: 0.2 K/uL (ref 0.0–0.5)
Eosinophils Relative: 2 %
HCT: 36.6 % (ref 36.0–46.0)
Hemoglobin: 11.7 g/dL — ABNORMAL LOW (ref 12.0–15.0)
Immature Granulocytes: 0 %
Lymphocytes Relative: 10 %
Lymphs Abs: 0.9 K/uL (ref 0.7–4.0)
MCH: 33.7 pg (ref 26.0–34.0)
MCHC: 32 g/dL (ref 30.0–36.0)
MCV: 105.5 fL — ABNORMAL HIGH (ref 80.0–100.0)
Monocytes Absolute: 0.4 K/uL (ref 0.1–1.0)
Monocytes Relative: 4 %
Neutro Abs: 7.5 K/uL (ref 1.7–7.7)
Neutrophils Relative %: 84 %
Platelets: 242 K/uL (ref 150–400)
RBC: 3.47 MIL/uL — ABNORMAL LOW (ref 3.87–5.11)
RDW: 13.2 % (ref 11.5–15.5)
WBC: 9 K/uL (ref 4.0–10.5)
nRBC: 0 % (ref 0.0–0.2)

## 2024-06-24 LAB — COMPREHENSIVE METABOLIC PANEL WITH GFR
ALT: <5 U/L (ref 0–44)
AST: 20 U/L (ref 15–41)
Albumin: 3.8 g/dL (ref 3.5–5.0)
Alkaline Phosphatase: 83 U/L (ref 38–126)
Anion gap: 10 (ref 5–15)
BUN: 32 mg/dL — ABNORMAL HIGH (ref 8–23)
CO2: 27 mmol/L (ref 22–32)
Calcium: 9.5 mg/dL (ref 8.9–10.3)
Chloride: 106 mmol/L (ref 98–111)
Creatinine, Ser: 0.81 mg/dL (ref 0.44–1.00)
GFR, Estimated: >60 mL/min
Glucose, Bld: 96 mg/dL (ref 70–99)
Potassium: 4.1 mmol/L (ref 3.5–5.1)
Sodium: 143 mmol/L (ref 135–145)
Total Bilirubin: 0.2 mg/dL (ref 0.0–1.2)
Total Protein: 6.3 g/dL — ABNORMAL LOW (ref 6.5–8.1)

## 2024-06-24 MED ORDER — OXYMETAZOLINE HCL 0.05 % NA SOLN
1.0000 | Freq: Once | NASAL | Status: AC
  Administered 2024-06-24: 1 via NASAL
  Filled 2024-06-24: qty 30

## 2024-06-24 MED ORDER — TETANUS-DIPHTH-ACELL PERTUSSIS 5-2-15.5 LF-MCG/0.5 IM SUSP
0.5000 mL | Freq: Once | INTRAMUSCULAR | Status: AC
  Administered 2024-06-24: 0.5 mL via INTRAMUSCULAR
  Filled 2024-06-24: qty 0.5

## 2024-06-24 NOTE — ED Notes (Signed)
 Pt brief changed prior to dc

## 2024-06-24 NOTE — ED Provider Notes (Addendum)
 " [place] EMERGENCY DEPARTMENT AT [facility] Provider Note   CSN: [id number] Arrival date & time: 06/24/24  1317     Patient presents with: Jasmine Mcdonald is a 74 y.o. female.   Patient here for with EMS.  She fell out of her wheelchair hit her face.  Had nosebleed but now stopped.  Question if she is on any blood thinners per facility list she is not on any.  Per medical chart here seems like she might have been on Eliquis  in the past.  She is not having any pain except for pain over her forehead where she has a large hematoma per EMS.  She is wheelchair-bound has history of Parkinson's.  She tried this shift in her wheelchair and fell forward onto her face.  Does not have any pain elsewhere.  Bleeding has stopped from the nose.  She denies any weakness numbness tingling.  She has no extremity pain.  The history is provided by the patient and the EMS personnel.       Prior to Admission medications  Medication Sig Start Date End Date Taking? Authorizing Provider  acetaminophen  (TYLENOL ) 325 MG tablet Take 650 mg by mouth every 6 (six) hours as needed for mild pain (pain score 1-3) or fever.   Yes [provider]  aspirin 81 MG chewable tablet Chew 81 mg by mouth daily.   Yes [provider]  carbidopa -levodopa  (SINEMET  IR) 25-100 MG tablet Take 1 tablet by mouth 3 (three) times daily. 07/20/21  Yes [provider]  clotrimazole-betamethasone (LOTRISONE) lotion Apply 1 Application topically 2 (two) times daily.   Yes [provider]  escitalopram (LEXAPRO) 20 MG tablet Take 20 mg by mouth daily.   Yes [provider]  Lacosamide  100 MG TABS Take 100 mg by mouth 2 (two) times daily. 02/17/22  Yes [provider]  levothyroxine  (SYNTHROID ) 100 MCG tablet TAKE 1 TABLET BY MOUTH EVERY DAY Patient taking differently: Take 100 mcg by mouth every morning. 08/18/21  Yes Kaur, Charanpreet, NP  LORazepam  (ATIVAN ) 0.5 MG  tablet Take 1 tablet (0.5 mg total) by mouth every 12 (twelve) hours as needed for anxiety or sleep. Patient taking differently: Take 0.5 mg by mouth 2 (two) times daily. 03/20/22  Yes Ricky Fines, MD  metoprolol  tartrate (LOPRESSOR ) 50 MG tablet TAKE 1 TABLET BY MOUTH TWICE A DAY Patient taking differently: Take 50 mg by mouth 2 (two) times daily. 08/18/21  Yes Kaur, Charanpreet, NP  mirtazapine  (REMERON ) 45 MG tablet Take 45 mg by mouth at bedtime. 07/03/21  Yes [provider]  Multiple Vitamin (MULTIVITAMIN) capsule Take 1 capsule by mouth daily.   Yes [provider]  polyethylene glycol (MIRALAX / GLYCOLAX) 17 g packet Take 17 g by mouth daily.   Yes [provider]  traMADol  (ULTRAM ) 50 MG tablet Take 50 mg by mouth every 6 (six) hours as needed. 01/10/24  Yes [provider]  ondansetron  (ZOFRAN ) 4 MG tablet Take 1 tablet (4 mg total) by mouth every 8 (eight) hours as needed. Patient not taking: Reported on 06/24/2024 07/28/18   Floy Roberts, MD  QUEtiapine  (SEROQUEL ) 25 MG tablet Take 0.5 tablets (12.5 mg total) by mouth at bedtime. Patient not taking: Reported on 06/24/2024 03/20/22   Ricky Fines, MD  sertraline  (ZOLOFT ) 50 MG tablet Take 50 mg by mouth daily. Patient not taking: Reported on 06/24/2024 02/18/21   [provider]    Allergies: Patient has  no known allergies.    Review of Systems  Updated Vital Signs BP (!) 168/80   Pulse 68   Temp (!) 97.4 F (36.3 C) (Oral)   Resp (!) 26   Ht 5' 6 (1.676 m)   Wt 48 kg   SpO2 100%   BMI 17.08 kg/m   Physical Exam Vitals and nursing note reviewed.  Constitutional:      General: She is not in acute distress.    Appearance: She is well-developed.  HENT:     Head:     Comments: No active nosebleed no nasal septal hematoma    Nose: Nose normal.     Mouth/Throat:     Mouth: Mucous membranes are moist.  Eyes:     Extraocular Movements: Extraocular movements intact.      Conjunctiva/sclera: Conjunctivae normal.     Pupils: Pupils are equal, round, and reactive to light.  Cardiovascular:     Rate and Rhythm: Normal rate and regular rhythm.     Heart sounds: No murmur heard. Pulmonary:     Effort: Pulmonary effort is normal. No respiratory distress.     Breath sounds: Normal breath sounds.  Abdominal:     General: Abdomen is flat.     Palpations: Abdomen is soft.     Tenderness: There is no abdominal tenderness.  Musculoskeletal:        General: No swelling or tenderness.     Cervical back: Neck supple.     Comments: No midline spinal tenderness  Skin:    General: Skin is warm and dry.     Capillary Refill: Capillary refill takes less than 2 seconds.     Comments: Large hematoma over forehead, small abrasion/laceration of the bridge of the nose  Neurological:     General: No focal deficit present.     Mental Status: She is alert.  Psychiatric:        Mood and Affect: Mood normal.     (all labs ordered are listed, but only abnormal results are displayed) Labs Reviewed  CBC WITH DIFFERENTIAL/PLATELET  COMPREHENSIVE METABOLIC PANEL WITH GFR    EKG: EKG Interpretation Date/Time:  Wednesday June 24 2024 13:31:54 EST Ventricular Rate:  57 PR Interval:  60 QRS Duration:  139 QT Interval:  468 QTC Calculation: 456 R Axis:   80  Text Interpretation: Sinus rhythm Short PR interval Right bundle branch block Confirmed by Ruthe Cornet [phone]) on 06/24/2024 2:40:27 PM  Radiology: CT Head Wo Contrast Result Date: 06/24/2024 CLINICAL DATA:  Polytrauma. EXAM: CT HEAD WITHOUT CONTRAST CT MAXILLOFACIAL WITHOUT CONTRAST CT CERVICAL SPINE WITHOUT CONTRAST TECHNIQUE: Multidetector CT imaging of the head, cervical spine, and maxillofacial structures were performed using the standard protocol without intravenous contrast. Multiplanar CT image reconstructions of the cervical spine and maxillofacial structures were also generated. RADIATION DOSE REDUCTION:  This exam was performed according to the departmental dose-optimization program which includes automated exposure control, adjustment of the mA and/or kV according to patient size and/or use of iterative reconstruction technique. COMPARISON:  Head CT 03/18/2022 FINDINGS: CT HEAD FINDINGS Brain: The ventricles, cisterns and other CSF spaces are within normal. There is moderate chronic ischemic microvascular disease present. Small old lacunar infarct over the right base ganglia. No mass, mass effect, shift of midline structures or acute hemorrhage. Vascular: No hyperdense vessel or unexpected calcification. Skull: Minimally displaced nasal bone fractures present. No acute skull fracture. Other: Scalp contusion over the midline frontal scalp. CT MAXILLOFACIAL FINDINGS Osseous: There is a displaced  minimally comminuted nasal bone fracture. No additional facial bone fractures are visualized. Moderate patchy dental caries and periodontal disease. Orbits: Normal. Sinuses: Paranasal sinuses are well developed and well aerated without evidence of air-fluid level. Mastoid air cells are clear. Soft tissues: Scalp contusion over the midline frontal scalp at and above the level of the orbits. CT CERVICAL SPINE FINDINGS Alignment: Normal. Skull base and vertebrae: Mild spondylosis throughout the cervical spine to include uncovertebral joint spurring and facet arthropathy. Vertebral body heights are maintained. Atlantoaxial articulation is normal. No acute fracture. Minimal bilateral neural foraminal narrowing at the C6-7 level. Soft tissues and spinal canal: No prevertebral fluid or swelling. No visible canal hematoma. Disc levels:  Mild disc space narrowing at the C4-5 and C6-7 levels. Upper chest: No acute findings. Other: None. IMPRESSION: 1. No acute brain injury. 2. Moderate chronic ischemic microvascular disease and small old lacunar infarct over the right basal ganglia. 3. Minimally displaced nasal bone fracture. 4. Scalp  contusion over the midline frontal scalp at and above the level of the orbits. 5. No acute cervical spine injury. 6. Mild spondylosis of the cervical spine with mild disc disease at the C4-5 and C6-7 levels. Minimal bilateral neural foraminal narrowing at the C6-7 level. Electronically Signed   By: Toribio Agreste M.D.   On: 06/24/2024 14:44   CT Maxillofacial Wo Contrast Result Date: 06/24/2024 CLINICAL DATA:  Polytrauma. EXAM: CT HEAD WITHOUT CONTRAST CT MAXILLOFACIAL WITHOUT CONTRAST CT CERVICAL SPINE WITHOUT CONTRAST TECHNIQUE: Multidetector CT imaging of the head, cervical spine, and maxillofacial structures were performed using the standard protocol without intravenous contrast. Multiplanar CT image reconstructions of the cervical spine and maxillofacial structures were also generated. RADIATION DOSE REDUCTION: This exam was performed according to the departmental dose-optimization program which includes automated exposure control, adjustment of the mA and/or kV according to patient size and/or use of iterative reconstruction technique. COMPARISON:  Head CT 03/18/2022 FINDINGS: CT HEAD FINDINGS Brain: The ventricles, cisterns and other CSF spaces are within normal. There is moderate chronic ischemic microvascular disease present. Small old lacunar infarct over the right base ganglia. No mass, mass effect, shift of midline structures or acute hemorrhage. Vascular: No hyperdense vessel or unexpected calcification. Skull: Minimally displaced nasal bone fractures present. No acute skull fracture. Other: Scalp contusion over the midline frontal scalp. CT MAXILLOFACIAL FINDINGS Osseous: There is a displaced minimally comminuted nasal bone fracture. No additional facial bone fractures are visualized. Moderate patchy dental caries and periodontal disease. Orbits: Normal. Sinuses: Paranasal sinuses are well developed and well aerated without evidence of air-fluid level. Mastoid air cells are clear. Soft tissues: Scalp  contusion over the midline frontal scalp at and above the level of the orbits. CT CERVICAL SPINE FINDINGS Alignment: Normal. Skull base and vertebrae: Mild spondylosis throughout the cervical spine to include uncovertebral joint spurring and facet arthropathy. Vertebral body heights are maintained. Atlantoaxial articulation is normal. No acute fracture. Minimal bilateral neural foraminal narrowing at the C6-7 level. Soft tissues and spinal canal: No prevertebral fluid or swelling. No visible canal hematoma. Disc levels:  Mild disc space narrowing at the C4-5 and C6-7 levels. Upper chest: No acute findings. Other: None. IMPRESSION: 1. No acute brain injury. 2. Moderate chronic ischemic microvascular disease and small old lacunar infarct over the right basal ganglia. 3. Minimally displaced nasal bone fracture. 4. Scalp contusion over the midline frontal scalp at and above the level of the orbits. 5. No acute cervical spine injury. 6. Mild spondylosis of the cervical spine with  mild disc disease at the C4-5 and C6-7 levels. Minimal bilateral neural foraminal narrowing at the C6-7 level. Electronically Signed   By: Toribio Agreste M.D.   On: 06/24/2024 14:44   CT Cervical Spine Wo Contrast Result Date: 06/24/2024 CLINICAL DATA:  Polytrauma. EXAM: CT HEAD WITHOUT CONTRAST CT MAXILLOFACIAL WITHOUT CONTRAST CT CERVICAL SPINE WITHOUT CONTRAST TECHNIQUE: Multidetector CT imaging of the head, cervical spine, and maxillofacial structures were performed using the standard protocol without intravenous contrast. Multiplanar CT image reconstructions of the cervical spine and maxillofacial structures were also generated. RADIATION DOSE REDUCTION: This exam was performed according to the departmental dose-optimization program which includes automated exposure control, adjustment of the mA and/or kV according to patient size and/or use of iterative reconstruction technique. COMPARISON:  Head CT 03/18/2022 FINDINGS: CT HEAD FINDINGS  Brain: The ventricles, cisterns and other CSF spaces are within normal. There is moderate chronic ischemic microvascular disease present. Small old lacunar infarct over the right base ganglia. No mass, mass effect, shift of midline structures or acute hemorrhage. Vascular: No hyperdense vessel or unexpected calcification. Skull: Minimally displaced nasal bone fractures present. No acute skull fracture. Other: Scalp contusion over the midline frontal scalp. CT MAXILLOFACIAL FINDINGS Osseous: There is a displaced minimally comminuted nasal bone fracture. No additional facial bone fractures are visualized. Moderate patchy dental caries and periodontal disease. Orbits: Normal. Sinuses: Paranasal sinuses are well developed and well aerated without evidence of air-fluid level. Mastoid air cells are clear. Soft tissues: Scalp contusion over the midline frontal scalp at and above the level of the orbits. CT CERVICAL SPINE FINDINGS Alignment: Normal. Skull base and vertebrae: Mild spondylosis throughout the cervical spine to include uncovertebral joint spurring and facet arthropathy. Vertebral body heights are maintained. Atlantoaxial articulation is normal. No acute fracture. Minimal bilateral neural foraminal narrowing at the C6-7 level. Soft tissues and spinal canal: No prevertebral fluid or swelling. No visible canal hematoma. Disc levels:  Mild disc space narrowing at the C4-5 and C6-7 levels. Upper chest: No acute findings. Other: None. IMPRESSION: 1. No acute brain injury. 2. Moderate chronic ischemic microvascular disease and small old lacunar infarct over the right basal ganglia. 3. Minimally displaced nasal bone fracture. 4. Scalp contusion over the midline frontal scalp at and above the level of the orbits. 5. No acute cervical spine injury. 6. Mild spondylosis of the cervical spine with mild disc disease at the C4-5 and C6-7 levels. Minimal bilateral neural foraminal narrowing at the C6-7 level. Electronically  Signed   By: Toribio Agreste M.D.   On: 06/24/2024 14:44     .Laceration Repair  Date/Time: 06/24/2024 1:56 PM  Performed by: Ruthe Cornet, DO Authorized by: Ruthe Cornet, DO   Consent:    Consent obtained:  Verbal   Consent given by:  Patient   Risks, benefits, and alternatives were discussed: yes     Risks discussed:  Infection, need for additional repair, nerve damage, pain, retained foreign body, tendon damage, vascular damage, poor wound healing and poor cosmetic result   Alternatives discussed:  No treatment Universal protocol:    Procedure explained and questions answered to patient or proxy's satisfaction: yes     Patient identity confirmed:  Verbally with patient Anesthesia:    Anesthesia method:  None Laceration details:    Location: nose.   Length (cm):  1   Depth (mm):  1 Pre-procedure details:    Preparation:  Patient was prepped and draped in usual sterile fashion Exploration:    Imaging outcome: foreign  body not noted     Wound exploration: wound explored through full range of motion and entire depth of wound visualized     Wound extent: areolar tissue not violated, fascia not violated, no foreign body, no signs of injury, no nerve damage, no tendon damage, no underlying fracture and no vascular damage   Treatment:    Area cleansed with:  Shur-Clens   Amount of cleaning:  Standard Skin repair:    Repair method:  Steri-Strips and tissue adhesive   Number of Steri-Strips:  2 Approximation:    Approximation:  Close Repair type:    Repair type:  Simple Post-procedure details:    Dressing:  Open (no dressing)   Procedure completion:  Tolerated    Medications Ordered in the ED  Tdap (ADACEL ) injection 0.5 mL (0.5 mLs Intramuscular Given 06/24/24 1447)  oxymetazoline  (AFRIN) 0.05 % nasal spray 1 spray (1 spray Each Nare Given 06/24/24 1447)                                    Medical Decision Making Amount and/or Complexity of Data Reviewed Labs:  ordered. Radiology: ordered.  Risk OTC drugs. Prescription drug management.   Shona DELENA [place] is here after she fell out of her wheelchair at her nursing home.  She has a history of Parkinson's.  Unremarkable vitals here.  No fever.  She has got big hematoma going to her forehead small superficial laceration over the bridge of her nose.  Looks like she had a nosebleed with that hemostatic now.  Use Dermabond and Steri-Strips to her nasal wound.  Talked with the family on the phone.  Patient is wheelchair-bound at this facility.  She states that she was trying to reposition in the wheelchair and she fell forward onto her face.  Is not having any pain elsewhere.  Will get a CT scan of the head face neck and get basic labs.  I do suspect she has a nasal fracture.  Overall patient appears well otherwise.  Awaiting images and labs been handed off to oncoming ED staff with these things pending.  Please see oncoming ED staff's note for further results evaluation and disposition of the patient.  Tetanus shot was updated.  Nasal fracture seen on CT.  Otherwise images unremarkable.  This chart was dictated using voice recognition software.  Despite best efforts to proofread,  errors can occur which can change the documentation meaning.      Final diagnoses:  Traumatic hematoma of forehead, initial encounter  Abrasion of nose, initial encounter  Closed fracture of nasal bone, initial encounter    ED Discharge Orders     None          Ruthe Cornet, DO 06/24/24 1446    Ruthe Cornet, DO 06/24/24 1456  "

## 2024-06-24 NOTE — Discharge Instructions (Addendum)
 Follow-up with ENT your primary care doctor regarding nasal fracture.  I do not think there is anything surgical to do about this unless she is having long-term breathing issues from the fracture.  This should hopefully heal well.  You can use Afrin 1 spray up each nare as needed for congestion.  I would avoid blowing your nose.  Return if symptoms worsen.

## 2024-06-24 NOTE — ED Triage Notes (Signed)
 Pt coming in from [facility]. . Pt fell out of wheel chair and hit her face. Pt is NOT on elequis. Pt is alert and oriented x4 at this time.  Bp 141/79 Hr 54 Spo2 97%  Rr 18

## 2024-06-24 NOTE — ED Notes (Signed)
 [facility] contacted to transport pt back to [facility] in [place]

## 2024-06-24 NOTE — ED Provider Notes (Signed)
 Care assumed from Dr. Ruthe.  At time of transfer of care, patient awaiting right hand x-ray and screening laboratory testing to look for concerning finding in the context of this fall out of wheelchair today.  Previous team suspected patient was safe for discharge home once workup is completed.  X-ray of the hand did not show significant fractures and labs did not show critical abnormality.  I spoke with the patient, family and another family were on the phone and they agree with plan for discharge.  Will plan for discharge for outpatient follow-up with ENT due to the nose fracture.  Clinical Impression: 1. Traumatic hematoma of forehead, initial encounter   2. Abrasion of nose, initial encounter   3. Closed fracture of nasal bone, initial encounter     Disposition: Discharge  Condition: Good  I have discussed the results, Dx and Tx plan with the pt(& family if present). He/she/they expressed understanding and agree(s) with the plan. Discharge instructions discussed at great length. Strict return precautions discussed and pt &/or family have verbalized understanding of the instructions. No further questions at time of discharge.    New Prescriptions   No medications on file    Follow Up: Roark Rush, MD [address], [address] [place] [address] [phone]   follow up with ENT  Britta King, MD [address] [place] [address] [phone]  Schedule an appointment as soon as possible for a visit in 1 week   [facility] Emergency Department at [facility] [address] [place]  [phone]  As needed, If symptoms worsen     Tareq Dwan, Lonni PARAS, MD 06/24/24 1725

## 2024-07-03 ENCOUNTER — Institutional Professional Consult (permissible substitution) (INDEPENDENT_AMBULATORY_CARE_PROVIDER_SITE_OTHER): Admitting: Physician Assistant
